# Patient Record
Sex: Female | Born: 1960 | Race: White | Hispanic: No | Marital: Single | Smoking: Never smoker
Health system: Southern US, Community
[De-identification: ages and names within clinical notes are randomized; demographics above are authoritative.]

## PROBLEM LIST (undated history)

## (undated) DIAGNOSIS — D509 Iron deficiency anemia, unspecified: Secondary | ICD-10-CM

## (undated) HISTORY — DX: Iron deficiency anemia, unspecified: D50.9

---
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
## 2002-07-27 ENCOUNTER — Encounter: Admission: RE | Admit: 2002-07-27 | Discharge: 2002-07-27 | Payer: Self-pay | Admitting: Family Medicine

## 2006-07-17 ENCOUNTER — Ambulatory Visit (HOSPITAL_COMMUNITY): Payer: Self-pay | Admitting: *Deleted

## 2008-01-19 ENCOUNTER — Emergency Department (HOSPITAL_COMMUNITY): Admission: EM | Admit: 2008-01-19 | Discharge: 2008-01-19 | Payer: Self-pay | Admitting: Emergency Medicine

## 2010-03-19 ENCOUNTER — Emergency Department (HOSPITAL_COMMUNITY): Admission: EM | Admit: 2010-03-19 | Discharge: 2010-03-20 | Payer: Self-pay | Admitting: Emergency Medicine

## 2010-03-20 ENCOUNTER — Inpatient Hospital Stay (HOSPITAL_COMMUNITY): Admission: AD | Admit: 2010-03-20 | Discharge: 2010-03-23 | Payer: Self-pay | Admitting: Psychiatry

## 2010-03-20 ENCOUNTER — Ambulatory Visit: Payer: Self-pay | Admitting: Psychiatry

## 2010-03-20 DIAGNOSIS — IMO0002 Reserved for concepts with insufficient information to code with codable children: Secondary | ICD-10-CM

## 2010-07-24 LAB — CBC
MCH: 20.9 pg — ABNORMAL LOW (ref 26.0–34.0)
MCHC: 30.8 g/dL (ref 30.0–36.0)
Platelets: 415 10*3/uL — ABNORMAL HIGH (ref 150–400)
RBC: 4.83 MIL/uL (ref 3.87–5.11)
RDW: 17.7 % — ABNORMAL HIGH (ref 11.5–15.5)

## 2010-07-24 LAB — DIFFERENTIAL
Basophils Absolute: 0 10*3/uL (ref 0.0–0.1)
Eosinophils Absolute: 0.1 10*3/uL (ref 0.0–0.7)
Lymphs Abs: 2.2 10*3/uL (ref 0.7–4.0)
Monocytes Absolute: 0.9 10*3/uL (ref 0.1–1.0)

## 2010-07-24 LAB — BASIC METABOLIC PANEL
CO2: 20 mEq/L (ref 19–32)
Calcium: 10 mg/dL (ref 8.4–10.5)
Creatinine, Ser: 1.11 mg/dL (ref 0.4–1.2)
Glucose, Bld: 107 mg/dL — ABNORMAL HIGH (ref 70–99)
Potassium: 3.8 mEq/L (ref 3.5–5.1)
Sodium: 140 mEq/L (ref 135–145)

## 2010-07-24 LAB — URINALYSIS, ROUTINE W REFLEX MICROSCOPIC
Bilirubin Urine: NEGATIVE
Hgb urine dipstick: NEGATIVE
Protein, ur: NEGATIVE mg/dL
Urobilinogen, UA: 0.2 mg/dL (ref 0.0–1.0)

## 2010-07-24 LAB — RAPID URINE DRUG SCREEN, HOSP PERFORMED
Barbiturates: NOT DETECTED
Benzodiazepines: NOT DETECTED
Cocaine: POSITIVE — AB

## 2010-07-24 LAB — ETHANOL: Alcohol, Ethyl (B): <5 mg/dL (ref 0–10)

## 2010-07-24 LAB — RETICULOCYTES
RBC.: 4.62 MIL/uL (ref 3.87–5.11)
Retic Ct Pct: 1.4 % (ref 0.4–3.1)

## 2010-07-24 LAB — IRON AND TIBC
Iron: 20 ug/dL — ABNORMAL LOW (ref 42–135)
Saturation Ratios: 4 % — ABNORMAL LOW (ref 20–55)
UIBC: 441 ug/dL

## 2010-07-24 LAB — TRICYCLICS SCREEN, URINE: TCA Scrn: NOT DETECTED

## 2010-07-24 LAB — FERRITIN: Ferritin: 2 ng/mL — ABNORMAL LOW (ref 10–291)

## 2012-05-13 DIAGNOSIS — D509 Iron deficiency anemia, unspecified: Secondary | ICD-10-CM

## 2012-05-13 HISTORY — DX: Iron deficiency anemia, unspecified: D50.9

## 2016-08-20 ENCOUNTER — Other Ambulatory Visit: Payer: Self-pay | Admitting: Internal Medicine

## 2016-08-22 ENCOUNTER — Other Ambulatory Visit: Payer: PRIVATE HEALTH INSURANCE | Admitting: Internal Medicine

## 2016-08-22 DIAGNOSIS — Z13220 Encounter for screening for lipoid disorders: Secondary | ICD-10-CM

## 2016-08-22 DIAGNOSIS — Z1329 Encounter for screening for other suspected endocrine disorder: Secondary | ICD-10-CM

## 2016-08-22 DIAGNOSIS — Z0000 Encounter for general adult medical examination without abnormal findings: Secondary | ICD-10-CM

## 2016-08-22 DIAGNOSIS — Z1321 Encounter for screening for nutritional disorder: Secondary | ICD-10-CM

## 2016-08-22 LAB — CBC WITH DIFFERENTIAL/PLATELET
Basophils Absolute: 0 cells/uL (ref 0–200)
Basophils Relative: 0 %
EOS PCT: 4 %
Eosinophils Absolute: 500 cells/uL (ref 15–500)
HCT: 44.3 % (ref 35.0–45.0)
HEMOGLOBIN: 14.6 g/dL (ref 11.7–15.5)
LYMPHS ABS: 3625 {cells}/uL (ref 850–3900)
Lymphocytes Relative: 29 %
MCH: 28.5 pg (ref 27.0–33.0)
MCHC: 33 g/dL (ref 32.0–36.0)
MCV: 86.5 fL (ref 80.0–100.0)
MPV: 10.4 fL (ref 7.5–12.5)
Monocytes Absolute: 875 cells/uL (ref 200–950)
Monocytes Relative: 7 %
NEUTROS PCT: 60 %
Neutro Abs: 7500 cells/uL (ref 1500–7800)
PLATELETS: 273 10*3/uL (ref 140–400)
RBC: 5.12 MIL/uL — AB (ref 3.80–5.10)
RDW: 15.3 % — AB (ref 11.0–15.0)
WBC: 12.5 10*3/uL — AB (ref 3.8–10.8)

## 2016-08-22 LAB — LIPID PANEL
CHOL/HDL RATIO: 5.4 ratio — AB (ref <5.0)
Cholesterol: 254 mg/dL — ABNORMAL HIGH (ref <200)
HDL: 47 mg/dL — AB (ref >50)
LDL Cholesterol: 165 mg/dL — ABNORMAL HIGH (ref <100)
Triglycerides: 208 mg/dL — ABNORMAL HIGH (ref <150)
VLDL: 42 mg/dL — AB (ref <30)

## 2016-08-22 LAB — COMPREHENSIVE METABOLIC PANEL
ALBUMIN: 4.2 g/dL (ref 3.6–5.1)
ALT: 18 U/L (ref 6–29)
AST: 15 U/L (ref 10–35)
Alkaline Phosphatase: 92 U/L (ref 33–130)
BILIRUBIN TOTAL: 0.5 mg/dL (ref 0.2–1.2)
BUN: 18 mg/dL (ref 7–25)
CO2: 26 mmol/L (ref 20–31)
CREATININE: 0.94 mg/dL (ref 0.50–1.05)
Calcium: 9.9 mg/dL (ref 8.6–10.4)
Chloride: 105 mmol/L (ref 98–110)
GLUCOSE: 88 mg/dL (ref 65–99)
Potassium: 4.8 mmol/L (ref 3.5–5.3)
SODIUM: 138 mmol/L (ref 135–146)
Total Protein: 6.9 g/dL (ref 6.1–8.1)

## 2016-08-22 LAB — TSH: TSH: 1.93 mIU/L

## 2016-08-23 ENCOUNTER — Encounter: Payer: Self-pay | Admitting: Internal Medicine

## 2016-08-23 LAB — VITAMIN D 25 HYDROXY (VIT D DEFICIENCY, FRACTURES): VIT D 25 HYDROXY: 10 ng/mL — AB (ref 30–100)

## 2016-08-26 ENCOUNTER — Encounter: Payer: Self-pay | Admitting: Internal Medicine

## 2016-08-26 ENCOUNTER — Other Ambulatory Visit (HOSPITAL_COMMUNITY)
Admission: RE | Admit: 2016-08-26 | Discharge: 2016-08-26 | Disposition: A | Discharge status: Home / Self Care | Payer: Self-pay | Source: Ambulatory Visit | Attending: Internal Medicine | Admitting: Internal Medicine

## 2016-08-26 ENCOUNTER — Ambulatory Visit (INDEPENDENT_AMBULATORY_CARE_PROVIDER_SITE_OTHER): Payer: PRIVATE HEALTH INSURANCE | Admitting: Internal Medicine

## 2016-08-26 VITALS — BP 178/100 | HR 93 | Temp 98.7°F | Ht 63.25 in | Wt 197.0 lb

## 2016-08-26 DIAGNOSIS — R8290 Unspecified abnormal findings in urine: Secondary | ICD-10-CM | POA: Diagnosis not present

## 2016-08-26 DIAGNOSIS — Z124 Encounter for screening for malignant neoplasm of cervix: Secondary | ICD-10-CM | POA: Diagnosis not present

## 2016-08-26 DIAGNOSIS — I10 Essential (primary) hypertension: Secondary | ICD-10-CM

## 2016-08-26 DIAGNOSIS — Z0000 Encounter for general adult medical examination without abnormal findings: Secondary | ICD-10-CM

## 2016-08-26 DIAGNOSIS — Z01419 Encounter for gynecological examination (general) (routine) without abnormal findings: Secondary | ICD-10-CM | POA: Insufficient documentation

## 2016-08-26 DIAGNOSIS — Z87898 Personal history of other specified conditions: Secondary | ICD-10-CM | POA: Diagnosis not present

## 2016-08-26 DIAGNOSIS — E559 Vitamin D deficiency, unspecified: Secondary | ICD-10-CM

## 2016-08-26 DIAGNOSIS — Z8659 Personal history of other mental and behavioral disorders: Secondary | ICD-10-CM

## 2016-08-26 DIAGNOSIS — Z8669 Personal history of other diseases of the nervous system and sense organs: Secondary | ICD-10-CM | POA: Diagnosis not present

## 2016-08-26 LAB — POCT URINALYSIS DIPSTICK
BILIRUBIN UA: NEGATIVE
GLUCOSE UA: NEGATIVE
Ketones, UA: NEGATIVE
LEUKOCYTES UA: NEGATIVE
NITRITE UA: NEGATIVE
Protein, UA: NEGATIVE
Spec Grav, UA: >=1.03 — AB (ref 1.010–1.025)
UROBILINOGEN UA: 0.2 U/dL
pH, UA: 6 (ref 5.0–8.0)

## 2016-08-26 MED ORDER — LOSARTAN POTASSIUM 50 MG PO TABS: 50.0000 mg | ORAL_TABLET | Freq: Every day | ORAL | 3 refills | Status: DC

## 2016-08-26 MED ORDER — ALPRAZOLAM 0.5 MG PO TABS
0.5000 mg | ORAL_TABLET | Freq: Every evening | ORAL | 1 refills | Status: DC | PRN
Start: 2016-08-26 — End: 2016-11-05

## 2016-08-26 MED ORDER — HYDROCHLOROTHIAZIDE 25 MG PO TABS: 25.0000 mg | ORAL_TABLET | Freq: Every day | ORAL | 3 refills | Status: DC
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
## 2016-08-27 LAB — URINALYSIS, MICROSCOPIC ONLY
Bacteria, UA: NONE SEEN [HPF]
Casts: NONE SEEN [LPF]
RBC / HPF: NONE SEEN RBC/HPF (ref <=2)
WBC, UA: NONE SEEN WBC/HPF (ref <=5)
Yeast: NONE SEEN [HPF]

## 2016-08-27 LAB — URINE CULTURE: Organism ID, Bacteria: NO GROWTH

## 2016-08-30 LAB — CYTOLOGY - PAP: Diagnosis: NEGATIVE

## 2016-09-04 MED ORDER — ERGOCALCIFEROL 1.25 MG (50000 UT) PO CAPS: 50000.0000 [IU] | ORAL_CAPSULE | ORAL | 0 refills | Status: DC

## 2016-09-04 NOTE — Patient Instructions (Signed)
Start losartan 50 mg daily. Refilled atenolol and HCTZ. Take vitamin D 50,000 units weekly for 12 weeks. Return in early May for follow-up. Small quantity of Xanax given to take at bedtime but not every night.

## 2016-09-13 ENCOUNTER — Ambulatory Visit: Payer: PRIVATE HEALTH INSURANCE | Admitting: Internal Medicine

## 2016-09-27 ENCOUNTER — Ambulatory Visit: Payer: PRIVATE HEALTH INSURANCE | Admitting: Internal Medicine

## 2016-09-28 ENCOUNTER — Telehealth: Payer: Self-pay | Admitting: Internal Medicine

## 2016-09-28 ENCOUNTER — Encounter: Payer: Self-pay | Admitting: Internal Medicine

## 2016-09-28 NOTE — Telephone Encounter (Signed)
Telephone call to patient today regarding missed appointment yesterday. Says mother was put in hospital with a collapsed lung but fortunately is coming home today. Patient says she totally forgot about appointment. This point was to follow-up on hypertension. Asked patient to call to schedule another appointment next week and she said she would. Says blood pressure medicine might need to be adjusted. Says she's checked her low pressure a few times and is doing fairly well.

## 2016-11-05 ENCOUNTER — Ambulatory Visit (INDEPENDENT_AMBULATORY_CARE_PROVIDER_SITE_OTHER): Payer: PRIVATE HEALTH INSURANCE | Admitting: Internal Medicine

## 2016-11-05 ENCOUNTER — Encounter: Payer: Self-pay | Admitting: Internal Medicine

## 2016-11-05 VITALS — BP 140/90 | HR 92 | Temp 98.4°F | Ht 63.0 in | Wt 200.0 lb

## 2016-11-05 DIAGNOSIS — F439 Reaction to severe stress, unspecified: Secondary | ICD-10-CM | POA: Diagnosis not present

## 2016-11-05 DIAGNOSIS — I10 Essential (primary) hypertension: Secondary | ICD-10-CM | POA: Insufficient documentation

## 2016-11-05 DIAGNOSIS — Z6835 Body mass index (BMI) 35.0-35.9, adult: Secondary | ICD-10-CM | POA: Diagnosis not present

## 2016-11-05 MED ORDER — ALPRAZOLAM 0.5 MG PO TABS: 0.5000 mg | ORAL_TABLET | Freq: Every evening | ORAL | 2 refills | Status: DC | PRN

## 2016-11-05 MED ORDER — LOSARTAN POTASSIUM 100 MG PO TABS: 100.0000 mg | ORAL_TABLET | Freq: Every day | ORAL | 0 refills | Status: DC

## 2016-11-05 NOTE — Progress Notes (Signed)
   Subjective:    Patient ID: Madison Norton, female    DOB: 12/06/1960, 56 y.o.   MRN: [id number]  HPI Patient is here for follow-up on hypertension. She's been taking losartan 50 mg daily. No longer taking Tenormin. Has not been taking HCTZ on a regular basis. Blood pressure is elevated today at 140 overnight knee. I had hoped it would be better but she's not been taking all  of the recommended medication. She says she was afraid HCTZ would cause urinary frequency.  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 She will need to find other employment. She will be losing her health insurance.  Patient is concerned about her weight. She says she lost 55 pounds in the past on orlistat. I told her I was not willing to prescribe any weight loss drugs at this point in time. I'm interested in her getting her blood pressure under control and being compliant with prescribed medications.  Since she's having considerable situational stress, I have refilled her Xanax.  Explained to her that I thought we should go up on losartan to 100 mg daily and I thought it was important she take HCTZ as well. She has plenty of HCTZ on hand. I have prescribed losartan 100 mg #90 with no refill. She was prescribed Xanax 0.5 mg #30 with 2 refills.    Review of Systems see above     Objective:   Physical Exam Spent 15 minutes speaking with her about these issues.       Assessment & Plan:  Essential hypertension-noncompliant with diuretic. Increase losartan 100 mg daily and asked patient to take diuretic. She should call me with follow-up blood pressures that she takes herself.  Anxiety and situational stress-see above regarding Xanax refill.  Obesity-knees to work on diet exercise and weight loss. Did not prescribe weight loss product today.  Plan: Mention the fact patient should have a basic metabolic  panel on losartan but since were changing doses at this point in time and she is losing her health insurance we have deferred that for now. Expect to see patient in 3 months. Refills will not be given until seen.

## 2016-11-05 NOTE — Patient Instructions (Addendum)
Have refills Xanax for 3 months. Increase losartan from 50-100 mg daily #90 with no refill. Take diuretic as previously prescribed. Return in 3 months.[id number]

## 2016-11-15 ENCOUNTER — Telehealth: Payer: Self-pay

## 2016-11-15 DIAGNOSIS — Z1239 Encounter for other screening for malignant neoplasm of breast: Secondary | ICD-10-CM

## 2016-11-15 NOTE — Telephone Encounter (Signed)
Order for Mammogram placed and letter sent  

## 2017-03-28 ENCOUNTER — Telehealth: Payer: Self-pay | Admitting: Internal Medicine

## 2017-03-28 MED ORDER — LOSARTAN POTASSIUM 100 MG PO TABS: 100.0000 mg | ORAL_TABLET | Freq: Every day | ORAL | 0 refills | Status: DC

## 2017-03-28 NOTE — Telephone Encounter (Signed)
Patient called back and advised that Dr. Lenord FellersBaxley wanted to see her.  Patient advised that she just cannot afford to come in prior to having insurance.  Patient wants to know if Dr. Lenord FellersBaxley will call her in more of the Losartan until her appointment in December.    Spoke with Dr. Lenord FellersBaxley and she agrees to do this until patient can be seen on 12/13.  Patient will keep her appointment and do this.    If patient doesn't keep appointment no additional medication will be called in after this.  Toni AmendCourtney will send medication to [facility].

## 2017-03-28 NOTE — Telephone Encounter (Signed)
Tried to call patient back, has a phone that has a voice mail that hasn't been set up yet.  Couldn't leave a message.  Will try again later.

## 2017-03-28 NOTE — Telephone Encounter (Signed)
Patient calling; states that she has a new job and will have new insurance in December.  Until then, she really cannot come in.  She did make an appointment to come in to see you to adjust her BP medication on 12/13.  She wants to know if you will put her back on Atenolol until then.  States that the Losartan has really been making her VERY sleepy.  States that she missed a dose a couple of times, and she messed around with it a  Little bit and took a 1/2 dose for a few days and really felt much better.  Of course we don't know what her recorded BP was, but she states she felt better.    [facility]  Best # for Contact:  [phone]  Thank you.

## 2017-03-28 NOTE — Telephone Encounter (Signed)
Sorry-- as per last note she needs OV she never called with BP readings and was to have followed up in September.

## 2017-04-24 ENCOUNTER — Ambulatory Visit: Payer: PRIVATE HEALTH INSURANCE | Admitting: Internal Medicine

## 2017-05-01 ENCOUNTER — Encounter: Payer: Self-pay | Admitting: Internal Medicine

## 2017-05-01 ENCOUNTER — Ambulatory Visit (INDEPENDENT_AMBULATORY_CARE_PROVIDER_SITE_OTHER): Payer: 59 | Admitting: Internal Medicine

## 2017-05-01 VITALS — BP 122/90 | HR 92 | Ht 63.0 in | Wt 213.0 lb

## 2017-05-01 DIAGNOSIS — I10 Essential (primary) hypertension: Secondary | ICD-10-CM | POA: Diagnosis not present

## 2017-05-01 DIAGNOSIS — M545 Low back pain, unspecified: Secondary | ICD-10-CM

## 2017-05-01 DIAGNOSIS — E049 Nontoxic goiter, unspecified: Secondary | ICD-10-CM | POA: Diagnosis not present

## 2017-05-01 DIAGNOSIS — G8929 Other chronic pain: Secondary | ICD-10-CM | POA: Diagnosis not present

## 2017-05-01 MED ORDER — CYCLOBENZAPRINE HCL 10 MG PO TABS: ORAL_TABLET | ORAL | 0 refills | Status: DC

## 2017-05-01 MED ORDER — LOSARTAN POTASSIUM 50 MG PO TABS: 50.0000 mg | ORAL_TABLET | Freq: Every day | ORAL | 1 refills | Status: DC

## 2017-05-01 MED ORDER — HYDROCHLOROTHIAZIDE 25 MG PO TABS: 25.0000 mg | ORAL_TABLET | Freq: Every day | ORAL | 0 refills | Status: DC

## 2017-05-01 NOTE — Progress Notes (Signed)
   Subjective:    Patient ID: Madison Norton, female    DOB: 15-Mar-1961, 56 y.o.   MRN: [id number]  HPI We have not seen her since June 2018.  At that visit we increased her losartan from 50-100 mg daily.  She said it made her feel tired.  She tried cutting back on it and felt she felt better.  So for a long time she was taking anxious losartan 50 mg daily.  Around that time she lost her job.  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  I told her I was uncomfortable with her treating herself and deciding on what dosage she was going to be taking but she said she was trying to make it last.  Today her blood pressure is pretty good but she took 100 mg of Cozaar this morning.  So it is impossible for me to know what dose is correct from her because she has been changing up on the dosages.  I have explained to her I think we will stay with losartan 50 mg and HCTZ 25 mg until she comes back in 3 weeks.  At that time if she is consistent with those dosages we will do a being met.  Also at her last visit we talked about her thyroid ultrasound.  She is interested in that once again but she is concerned about the cost.  She may call [facility] to find that out.  I went ahead and placed order for thyroid ultrasound.  She is having some low back pain and I have prescribed some Flexeril for her to take at bedtime.  She was late for her appointment today.    Review of Systems     Objective:   Physical Exam        Assessment & Plan:

## 2017-05-01 NOTE — Patient Instructions (Signed)
Losartan 50 mg daily and HCTZ 25 mg daily.  Follow-up in 3 weeks.  Have ordered thyroid ultrasound to assess goiter/nodules.  Flexeril 10 mg 1/2-1 tablet at bedtime for back pain.

## 2017-05-19 ENCOUNTER — Other Ambulatory Visit: Payer: Self-pay

## 2017-05-19 MED ORDER — ALPRAZOLAM 0.5 MG PO TABS: 0.5000 mg | ORAL_TABLET | Freq: Every evening | ORAL | 1 refills | Status: DC | PRN

## 2017-05-19 NOTE — Telephone Encounter (Signed)
Called in to [facility] [address] - , [place] - [phone] 1  Refill

## 2017-05-21 ENCOUNTER — Other Ambulatory Visit: Payer: Self-pay | Admitting: Internal Medicine

## 2017-05-21 DIAGNOSIS — I10 Essential (primary) hypertension: Secondary | ICD-10-CM

## 2017-05-21 NOTE — Progress Notes (Signed)
b

## 2017-05-22 ENCOUNTER — Ambulatory Visit: Payer: 59 | Admitting: Internal Medicine

## 2017-06-02 ENCOUNTER — Other Ambulatory Visit: Payer: Self-pay | Admitting: Internal Medicine

## 2017-06-02 ENCOUNTER — Ambulatory Visit: Payer: 59 | Admitting: Internal Medicine

## 2017-06-02 DIAGNOSIS — I10 Essential (primary) hypertension: Secondary | ICD-10-CM

## 2017-06-03 ENCOUNTER — Encounter: Payer: Self-pay | Admitting: Internal Medicine

## 2017-06-03 ENCOUNTER — Ambulatory Visit: Payer: 59 | Admitting: Internal Medicine

## 2017-06-03 VITALS — BP 140/92 | HR 104 | Ht 63.0 in | Wt 210.0 lb

## 2017-06-03 DIAGNOSIS — Z8659 Personal history of other mental and behavioral disorders: Secondary | ICD-10-CM

## 2017-06-03 DIAGNOSIS — J0100 Acute maxillary sinusitis, unspecified: Secondary | ICD-10-CM | POA: Diagnosis not present

## 2017-06-03 DIAGNOSIS — I10 Essential (primary) hypertension: Secondary | ICD-10-CM

## 2017-06-03 DIAGNOSIS — Z87898 Personal history of other specified conditions: Secondary | ICD-10-CM | POA: Diagnosis not present

## 2017-06-03 MED ORDER — LOSARTAN POTASSIUM-HCTZ 100-25 MG PO TABS: 1.0000 | ORAL_TABLET | Freq: Every day | ORAL | 1 refills | Status: DC

## 2017-06-03 MED ORDER — DOXYCYCLINE HYCLATE 100 MG PO TABS: 100.0000 mg | ORAL_TABLET | Freq: Two times a day (BID) | ORAL | 0 refills | Status: DC

## 2017-06-03 MED ORDER — LORAZEPAM 0.5 MG PO TABS: 0.5000 mg | ORAL_TABLET | Freq: Two times a day (BID) | ORAL | 0 refills | Status: DC | PRN

## 2017-06-03 NOTE — Patient Instructions (Addendum)
Increase Losartan to 100 mg and continue HCTZ  25 mg daily. Combine as one tablet. RTC 4 weeks.  Take doxycycline for respiratory infection.

## 2017-06-03 NOTE — Progress Notes (Signed)
   Subjective:    Patient ID: Madison Norton, female    DOB: 06/05/1960, 57 y.o.   MRN: [id number]  HPI In today to follow-up on hypertension.  At last visit we agreed she would try losartan 50 mg daily and HCTZ 25 mg daily.  We talked about ordering a thyroid ultrasound to assess goiter but I do not think she wants to go through with that at the present time.  She was given Flexeril at the time for back pain.  She has a history of insomnia and anxiety.  Has come down with respiratory infection.  Basic metabolic panel drawn today.  Taking Ativan 0.5 mg twice daily as needed  Review of Systems     Objective:   Physical Exam Blood pressure today is 140/92  TMs and chest are clear.  Pharynx is clear.  She sounds nasally congested.     Assessment & Plan:  Acute lower respiratory infection  Plan: Doxycycline 100 mg twice daily for 10 days.  Increase losartan to 100 mg daily and continue 25 mg daily of HCTZ.  Thus prescribed losartan 100/25-daily follow-up mid February with regard to blood pressure.

## 2017-06-04 LAB — BASIC METABOLIC PANEL
BUN: 13 mg/dL (ref 7–25)
CALCIUM: 10.6 mg/dL — AB (ref 8.6–10.4)
CHLORIDE: 103 mmol/L (ref 98–110)
CO2: 29 mmol/L (ref 20–32)
Creat: 0.9 mg/dL (ref 0.50–1.05)
Glucose, Bld: 123 mg/dL — ABNORMAL HIGH (ref 65–99)
Potassium: 4.4 mmol/L (ref 3.5–5.3)
Sodium: 140 mmol/L (ref 135–146)

## 2017-06-09 ENCOUNTER — Encounter: Payer: Self-pay | Admitting: Internal Medicine

## 2017-06-30 ENCOUNTER — Ambulatory Visit (INDEPENDENT_AMBULATORY_CARE_PROVIDER_SITE_OTHER): Payer: 59 | Admitting: Internal Medicine

## 2017-06-30 ENCOUNTER — Encounter: Payer: Self-pay | Admitting: Internal Medicine

## 2017-06-30 VITALS — BP 120/80 | HR 101 | Ht 63.0 in | Wt 209.0 lb

## 2017-06-30 DIAGNOSIS — Z8659 Personal history of other mental and behavioral disorders: Secondary | ICD-10-CM | POA: Diagnosis not present

## 2017-06-30 DIAGNOSIS — G43109 Migraine with aura, not intractable, without status migrainosus: Secondary | ICD-10-CM | POA: Diagnosis not present

## 2017-06-30 DIAGNOSIS — I10 Essential (primary) hypertension: Secondary | ICD-10-CM | POA: Diagnosis not present

## 2017-06-30 MED ORDER — ZOLMITRIPTAN 5 MG PO TABS: ORAL_TABLET | ORAL | 0 refills | Status: DC

## 2017-06-30 NOTE — Patient Instructions (Signed)
Continue losartan HCTZ 100/25 as previously prescribed.  May try Zomig for migraine headache with aura.  Continue diet exercise and weight loss regimen.  Try to take pulse on a regular basis and see if it is fast.  Try to take blood pressure at least once a week.  Return in 3 months.

## 2017-06-30 NOTE — Progress Notes (Signed)
   Subjective:    Patient ID: Madison Norton, female    DOB: 12/21/1960, 57 y.o.   MRN: [id number]  HPI She is here today to follow-up on hypertension.  Currently on losartan HCTZ 100/25.  Blood pressure is good today.  This morning she had what she calls an ocular migraine where she had an aura and then subsequently developed a headache.  Sometimes she does not develop a headache.  I have given her a coupon for Zomig and prescribe Zomig tablets 5 mg to take at onset of headache.  She has had these for years.  New job continues to be a bit stressful.    Review of Systems see above     Objective:   Physical Exam  Blood pressure on arrival was 128/88.  When I checked it it was 140/90.  I rechecked her blood pressure and it was 120/80.      Assessment & Plan:  Migraine headaches with aura-prescribe Zomig 5 mg at onset of headache  Essential hypertension-her pulse is elevated a bit today.  She attributes this to anxiety.  She would like to exercise more when the weather improves.  She used  to take Tenormin for hypertension which likely controlled her pulse.  We will continue to monitor blood pressure and she will return in 3 months.  For now we are going to keep her on losartan HCTZ 100/25 daily.  I would like her to check her pulse on a regular basis.  If tachycardia persists we could consider adding Tenormin 25 mg daily particularly if blood pressure is labile.  I would like her to take her blood pressure at least once a week and to take her pulse daily.

## 2017-07-08 ENCOUNTER — Telehealth: Payer: Self-pay | Admitting: Internal Medicine

## 2017-07-08 NOTE — Telephone Encounter (Signed)
Needs OV.  

## 2017-07-08 NOTE — Telephone Encounter (Signed)
Spoke with Dr. Lenord FellersBaxley and she advised that patient must have an office visit based on her symptoms.  Called patient back and she said she really doesn't have the money to come in.  Offered her appointment for tomorrow, 2/27 @ 11:00 a.m.  Advised patient that we do not normally see patient's on Wednesday, but Dr. Lenord FellersBaxley will see her at [phone].  Patient is going to rest up tonight and call first thing in the morning to confirm appointment.

## 2017-07-08 NOTE — Telephone Encounter (Signed)
Madison SpanielSaid she has been sick she was here on 2/18 of last week.  On Wednesday, she started with fever and body aches and dizzy feeling.  She thought she would just take some ibuprofen for the fever and she had some Doxycycline that you had given her for something earlier and she thought she would just use that IF she needed it.  She said by the weekend she was in the bed and didn't really have the strength to get out of bed to make a sandwich or anything.  She went back to work today, but she is worn out and thinks it was a mistake to go back today.  She says she still feels light headed and is worn out this evening.  She now wonders if she had the flu and says that she didn't start taking the doxycycline until yesterday.  She hasn't taken her temperature.  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    She doesn't want to have to come in unless she has to since she was just here last week.  Advised her that she wasn't having these symptoms on Monday of last week when she was here.  And, it's hard to know how to manage her illness when you can't look at her and listen to her chest.  And, it sounds like she has a lot going on based on what she just told me.  However, I would speak with you.  Also advised her that we don't see patients on Wednesday and it's 4:50 on Tuesday afternoon.        Ph[facility]:  [facility] [address]  Phone #:  [phone]

## 2017-07-09 ENCOUNTER — Encounter: Payer: Self-pay | Admitting: Internal Medicine

## 2017-07-09 ENCOUNTER — Ambulatory Visit (INDEPENDENT_AMBULATORY_CARE_PROVIDER_SITE_OTHER): Payer: 59 | Admitting: Internal Medicine

## 2017-07-09 ENCOUNTER — Ambulatory Visit
Admission: RE | Admit: 2017-07-09 | Discharge: 2017-07-09 | Disposition: A | Discharge status: Home / Self Care | Payer: No Typology Code available for payment source | Source: Ambulatory Visit | Attending: Internal Medicine | Admitting: Internal Medicine

## 2017-07-09 VITALS — BP 110/80 | HR 103 | Temp 99.1°F | Wt 202.0 lb

## 2017-07-09 DIAGNOSIS — R6883 Chills (without fever): Secondary | ICD-10-CM

## 2017-07-09 DIAGNOSIS — R05 Cough: Secondary | ICD-10-CM

## 2017-07-09 DIAGNOSIS — J181 Lobar pneumonia, unspecified organism: Secondary | ICD-10-CM | POA: Diagnosis not present

## 2017-07-09 DIAGNOSIS — J189 Pneumonia, unspecified organism: Secondary | ICD-10-CM

## 2017-07-09 DIAGNOSIS — R509 Fever, unspecified: Secondary | ICD-10-CM

## 2017-07-09 DIAGNOSIS — R059 Cough, unspecified: Secondary | ICD-10-CM

## 2017-07-09 LAB — POCT INFLUENZA A/B
Influenza A, POC: NEGATIVE
Influenza B, POC: NEGATIVE

## 2017-07-09 MED ORDER — BUDESONIDE-FORMOTEROL FUMARATE 160-4.5 MCG/ACT IN AERO: 2.0000 | INHALATION_SPRAY | Freq: Two times a day (BID) | RESPIRATORY_TRACT | 3 refills | Status: DC

## 2017-07-09 MED ORDER — HYDROCODONE-HOMATROPINE 5-1.5 MG/5ML PO SYRP: 5.0000 mL | ORAL_SOLUTION | Freq: Three times a day (TID) | ORAL | 0 refills | Status: DC | PRN

## 2017-07-09 MED ORDER — CEFTRIAXONE SODIUM 1 G IJ SOLR
1.0000 g | Freq: Once | INTRAMUSCULAR | Status: AC
  Administered 2017-07-09: 1 g via INTRAMUSCULAR

## 2017-07-09 NOTE — Progress Notes (Signed)
   Subjective:    Patient ID: Madison Norton, female    DOB: 01/02/1961, 57 y.o.   MRN: [id number]  HPI 57 year old Female in today with onset a week ago of cough.  Thought she was coming down with a cold.  Several coworkers have been ill with flulike illnesses.  Cough is been dry and hacking with no significant production.  She tried Delsym and Zicam.  She tried to go to work yesterday but felt awful and was overwhelmed with the amount of customer she had to deal with.  She has malaise and fatigue.  She cannot think straight.  No documented temperature because she does not have a thermometer.  She has had chills.  Rapid flu test today in the office is negative.  She looks fatigued.  Does not really have headache but says he had feels a bit foggy and she feels she cannot quite think straight or concentrate.  She did not take flu vaccine this season.    Review of Systems no nausea or vomiting.  Only urinated once yesterday.     Objective:   Physical Exam Skin warm and dry.  TMs are slightly full bilaterally.  Pharynx is clear.  Neck is supple without adenopathy.  She has rhonchi and rales in left lower lobe with slight wheezing.  Has congested cough.  Chest x-ray is negative for pneumonia but she is volume depleted.       Assessment & Plan:  Probable left lower lobe pneumonia  Plan: Out of work for the remainder of the week.  Rocephin 1 g IM given in office.  Finish course of doxycycline that she started.  She has 5 additional days of antibiotic.  Follow-up on Monday March 4.  Rest and drink plenty of fluids.  Needs to hydrate well.  Coupon given for Symbicort inhaler 160/4.5.  Hycodan 1 teaspoon p.o. every 8 hours as needed cough.

## 2017-07-09 NOTE — Progress Notes (Deleted)
CHES

## 2017-07-09 NOTE — Patient Instructions (Signed)
Rest and drink plenty of fluids.  Rocephin 1 g IM given in office.  Continue doxycycline 100 mg twice daily.  Hycodan 1 teaspoon p.o. every 8 hours as needed cough.

## 2017-07-10 NOTE — Telephone Encounter (Signed)
Patient was given appointment for 2/27 and seen and treated.

## 2017-07-14 ENCOUNTER — Ambulatory Visit (INDEPENDENT_AMBULATORY_CARE_PROVIDER_SITE_OTHER): Payer: 59 | Admitting: Internal Medicine

## 2017-07-14 ENCOUNTER — Encounter: Payer: Self-pay | Admitting: Internal Medicine

## 2017-07-14 VITALS — BP 120/90 | HR 114 | Temp 99.6°F | Wt 203.0 lb

## 2017-07-14 DIAGNOSIS — J22 Unspecified acute lower respiratory infection: Secondary | ICD-10-CM | POA: Diagnosis not present

## 2017-07-14 DIAGNOSIS — J0100 Acute maxillary sinusitis, unspecified: Secondary | ICD-10-CM | POA: Diagnosis not present

## 2017-07-14 MED ORDER — DOXYCYCLINE HYCLATE 100 MG PO TABS: 100.0000 mg | ORAL_TABLET | Freq: Two times a day (BID) | ORAL | 0 refills | Status: DC

## 2017-07-23 ENCOUNTER — Telehealth: Payer: Self-pay | Admitting: Internal Medicine

## 2017-07-23 MED ORDER — HYDROCODONE-HOMATROPINE 5-1.5 MG/5ML PO SYRP: 5.0000 mL | ORAL_SOLUTION | Freq: Three times a day (TID) | ORAL | 0 refills | Status: DC | PRN

## 2017-07-23 NOTE — Progress Notes (Signed)
   Subjective:    Patient ID: Madison Norton, female    DOB: 02/21/1961, 57 y.o.   MRN: [id number]  HPI Here today to follow-up on bronchitis and flu symptoms.  Still does not feel well.  She is weak and washed out.  Tried to rest over the weekend.  She has almost finished doxycycline.  Did not go to work today.  Did not eat much over the weekend.  Has Symbicort inhaler.  Still coughing.  Has Hycodan.    Review of Systems see above     Objective:   Physical Exam Looks fatigued.  Pharynx is clear.  TMs are clear.  Neck is supple.  Chest clear to auscultation       Assessment & Plan:  Acute bronchitis-slowly improving  Plan: Refill doxycycline for an additional 10 days.  Hycodan sparingly for cough.  Stay well-hydrated.

## 2017-07-23 NOTE — Telephone Encounter (Signed)
Printed rx, pt was notified to come pick up rx.

## 2017-07-23 NOTE — Telephone Encounter (Signed)
Refill Hycodan and patient can pick up prescription

## 2017-07-23 NOTE — Telephone Encounter (Signed)
She's feeling much better but still has the lingering cough.  She's tried to use the cough syrup sparingly but is out.  She is coughing at night and can't sleep well.  She has gone back to work.  She has had to go in late however on a few days because she has been tired from not being able to sleep.  She's going in late today because she couldn't sleep last night for coughing.    I told her that we cannot call Hycodan in, this is something that she would have to come and pick up.  Will you refill it?  Or, does she need to take something else?

## 2017-08-05 ENCOUNTER — Encounter: Payer: Self-pay | Admitting: Internal Medicine

## 2017-08-05 NOTE — Patient Instructions (Signed)
Repeat course of doxycycline 100 mg twice daily for 10 days.  Take  Hycodan sparingly.  May return to work tomorrow.

## 2017-09-10 ENCOUNTER — Other Ambulatory Visit: Payer: Self-pay | Admitting: Internal Medicine

## 2017-09-29 ENCOUNTER — Ambulatory Visit: Payer: 59 | Admitting: Internal Medicine

## 2017-11-28 ENCOUNTER — Other Ambulatory Visit: Payer: Self-pay | Admitting: Internal Medicine

## 2017-12-08 ENCOUNTER — Ambulatory Visit (INDEPENDENT_AMBULATORY_CARE_PROVIDER_SITE_OTHER): Payer: 59 | Admitting: Internal Medicine

## 2017-12-08 ENCOUNTER — Encounter: Payer: Self-pay | Admitting: Internal Medicine

## 2017-12-08 VITALS — BP 130/84 | HR 80 | Ht 63.0 in | Wt 210.0 lb

## 2017-12-08 DIAGNOSIS — I10 Essential (primary) hypertension: Secondary | ICD-10-CM

## 2017-12-08 DIAGNOSIS — Z8659 Personal history of other mental and behavioral disorders: Secondary | ICD-10-CM | POA: Diagnosis not present

## 2017-12-08 DIAGNOSIS — M545 Low back pain, unspecified: Secondary | ICD-10-CM | POA: Insufficient documentation

## 2017-12-08 DIAGNOSIS — Z87898 Personal history of other specified conditions: Secondary | ICD-10-CM | POA: Diagnosis not present

## 2017-12-08 DIAGNOSIS — Z6837 Body mass index (BMI) 37.0-37.9, adult: Secondary | ICD-10-CM

## 2017-12-08 DIAGNOSIS — F419 Anxiety disorder, unspecified: Secondary | ICD-10-CM | POA: Insufficient documentation

## 2017-12-08 LAB — BASIC METABOLIC PANEL
BUN: 16 mg/dL (ref 7–25)
CALCIUM: 10.5 mg/dL — AB (ref 8.6–10.4)
CO2: 26 mmol/L (ref 20–32)
Chloride: 104 mmol/L (ref 98–110)
Creat: 0.99 mg/dL (ref 0.50–1.05)
Glucose, Bld: 108 mg/dL — ABNORMAL HIGH (ref 65–99)
Potassium: 4 mmol/L (ref 3.5–5.3)
Sodium: 140 mmol/L (ref 135–146)

## 2017-12-08 MED ORDER — LORAZEPAM 1 MG PO TABS: 1.0000 mg | ORAL_TABLET | Freq: Two times a day (BID) | ORAL | 0 refills | Status: DC | PRN

## 2017-12-08 MED ORDER — LOSARTAN POTASSIUM-HCTZ 100-25 MG PO TABS: 1.0000 | ORAL_TABLET | Freq: Every day | ORAL | 0 refills | Status: DC

## 2017-12-08 MED ORDER — TRAMADOL HCL 50 MG PO TABS: 50.0000 mg | ORAL_TABLET | Freq: Two times a day (BID) | ORAL | 1 refills | Status: DC | PRN

## 2017-12-08 NOTE — Progress Notes (Signed)
   Subjective:    Patient ID: Madison Norton, female    DOB: 12/18/1960, 57 y.o.   MRN: [id number]  HPI 57 year old Female in today to follow-up on essential hypertension, situational stress and anxiety.  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  She does not plan to take the animal to work.  She would like to travel with it.  Says puppy has helped her anxiety.  It gets her out of the house.  However, she wants a refill on lorazepam for anxiety and would like it increased from 0.5 mg to 1 mg.  She is also having some low back pain and would like to have some pain medication on hand.  I have agreed to give her some tramadol for back pain.  She really came in today for follow-up on hypertension because she had not been here in some time and her prescription needed to be refilled.  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  Has been thinking about moving to [place].    Review of Systems see above     Objective:   Physical Exam Blood pressure stable at 130/84.  BMI is 37.20 and height is 5 feet 3 inches.  Pulse is 80 and regular.  Chest clear.  Cardiac exam regular rate and rhythm.  No lower extremity edema.       Assessment & Plan:  BMI 37.20  Anxiety-prescribed lorazepam- prescribed 1 mg twice daily #60 with 0 refill  Letter requested and written to have service dog for travel  Essential hypertension-stable on current regimen of losartan HCTZ-refill #90 with no refill  Low back pain to be treated sparingly with tramadol 50 mg #30 with 1 refill to take 1 p.o. every 12 hours as needed pain.  Basic metabolic panel drawn today.  She needs physical exam in the next 6 months.  Last physical exam was [place] 2018.  Finances have been an issue for her.

## 2017-12-08 NOTE — Patient Instructions (Addendum)
Letter written for service dog.  Continue  same antihypertensive medication.  Basic metabolic panel drawn and pending.  Lorazepam 1 mg twice daily as needed for anxiety #60 with no refill.  Refill antihypertensive medication for 90 days with no refill.  Need physical exam in the next 6 months.  Take tramadol sparingly every 12 hours for back pain #30 with 1 refill.

## 2017-12-09 ENCOUNTER — Other Ambulatory Visit: Payer: Self-pay | Admitting: Internal Medicine

## 2017-12-09 ENCOUNTER — Encounter: Payer: Self-pay | Admitting: Internal Medicine

## 2017-12-10 ENCOUNTER — Other Ambulatory Visit: Payer: Self-pay | Admitting: Internal Medicine

## 2017-12-15 ENCOUNTER — Other Ambulatory Visit: Payer: 59 | Admitting: Internal Medicine

## 2017-12-17 ENCOUNTER — Telehealth: Payer: Self-pay | Admitting: Internal Medicine

## 2017-12-17 ENCOUNTER — Encounter: Payer: Self-pay | Admitting: Internal Medicine

## 2017-12-17 NOTE — Progress Notes (Unsigned)
Did not come for lab. Called pt on August 7th to reschedule. Agrees to come August 15.
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
## 2017-12-25 ENCOUNTER — Other Ambulatory Visit: Payer: 59 | Admitting: Internal Medicine

## 2017-12-26 LAB — PTH, INTACT AND CALCIUM
CALCIUM: 10.5 mg/dL — AB (ref 8.6–10.4)
PTH: 22 pg/mL (ref 14–64)

## 2018-02-01 ENCOUNTER — Other Ambulatory Visit: Payer: Self-pay | Admitting: Internal Medicine

## 2018-02-10 ENCOUNTER — Other Ambulatory Visit: Payer: Self-pay | Admitting: Internal Medicine

## 2018-03-26 ENCOUNTER — Other Ambulatory Visit: Payer: Self-pay | Admitting: Emergency Medicine

## 2018-03-26 MED ORDER — LOSARTAN POTASSIUM 100 MG PO TABS: 100.0000 mg | ORAL_TABLET | Freq: Every day | ORAL | 0 refills | Status: DC

## 2018-03-26 MED ORDER — LORAZEPAM 1 MG PO TABS: 1.0000 mg | ORAL_TABLET | Freq: Two times a day (BID) | ORAL | 2 refills | Status: DC | PRN

## 2018-03-26 NOTE — Telephone Encounter (Signed)
Pt was told she needed CPE in next 6 months in July. She will at least need OV in January. Refill Losartan and Lorazepam x 90 days.

## 2018-03-26 NOTE — Telephone Encounter (Signed)
Pt called stating she has lost her job and her insurance so she is needing a refill of her medications at a cheaper pharmacy. She is requesting a refill on her losartan (COZAAR) 100 MG tablet. [facility]acy is [facility] on [address]. She is also wondering if she can get a refill on either her Lorazepam 1MG  tablet or her Xanax, Pt stated she prefers Xanax. She is having an extremely difficult time with the job loss. Thanks.

## 2018-03-30 NOTE — Addendum Note (Signed)
Addended by: Gregery NaVALENCIA, Falisa Lamora P on: 03/30/2018 04:51 PM   Modules accepted: Orders

## 2018-03-30 NOTE — Telephone Encounter (Signed)
Okay to change losartan?

## 2018-03-30 NOTE — Telephone Encounter (Signed)
Patient called checking on these refills. She states that she would like them sent to the [facility] on [address]. Patient also mentioned that the Losartan should be losartan-hydrochlorothiazide (HYZAAR) 100-25 MG tablet. She said that at one point the pharmacy she was using was out of it so it was switched and she would like to take was she was previously on.

## 2018-03-31 MED ORDER — LOSARTAN POTASSIUM-HCTZ 100-25 MG PO TABS: 1.0000 | ORAL_TABLET | Freq: Every day | ORAL | 0 refills | Status: DC

## 2018-03-31 MED ORDER — LORAZEPAM 1 MG PO TABS: 1.0000 mg | ORAL_TABLET | Freq: Two times a day (BID) | ORAL | 1 refills | Status: DC | PRN

## 2018-03-31 NOTE — Telephone Encounter (Signed)
Refill these untilFeb

## 2018-04-01 ENCOUNTER — Other Ambulatory Visit: Payer: Self-pay

## 2018-04-01 MED ORDER — LOSARTAN POTASSIUM-HCTZ 100-25 MG PO TABS: 1.0000 | ORAL_TABLET | Freq: Every day | ORAL | 0 refills | Status: DC

## 2018-04-01 MED ORDER — LORAZEPAM 1 MG PO TABS: 1.0000 mg | ORAL_TABLET | Freq: Two times a day (BID) | ORAL | 1 refills | Status: DC | PRN

## 2018-04-02 ENCOUNTER — Telehealth: Payer: Self-pay | Admitting: Internal Medicine

## 2018-04-02 MED ORDER — LOSARTAN POTASSIUM-HCTZ 100-25 MG PO TABS: 1.0000 | ORAL_TABLET | Freq: Every day | ORAL | 0 refills | Status: DC

## 2018-04-02 MED ORDER — LORAZEPAM 1 MG PO TABS: 1.0000 mg | ORAL_TABLET | Freq: Two times a day (BID) | ORAL | 0 refills | Status: DC | PRN

## 2018-04-02 NOTE — Telephone Encounter (Signed)
Pt called after 5pm today saying her prescriptions were not at pharmacy. Says she has changed pharmacies and had called and informed my staff earlier in the week that she had changed from [facility]  on [address] to [facility] on [address]. She apparently needs Lorazepam and Losartan HCTZ. She has agreed to be seen January 10th. She says she lost her job recently.  Her Rxs at [facility] have been electronically discontinued. New Rx sent for Losartan HCTZ 100/25 #90 with no refill to  [facility] [address] tonight and also Rx for #60 Lorazepam 1 mg with no refill to [facility] [address]. Left voicemail for pt this evening explaining what has been done. No other refills until seen in January.  MJB,MD

## 2018-05-21 NOTE — Telephone Encounter (Signed)
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  She is not able to come tomorrow due to just starting job.  She will have insurance in March and would like to R/S to come then.  She can come sooner, but will have to ask off and this is a new job and she would not have insurance.  Booked for 3/9 @ 2:45 for 6 month f/u.  Labs were not ordered, so this is just a 6 month recheck as patient has not been seen in several months.  And, patient has not had a CPE since her first CPE in 2018.

## 2018-05-22 ENCOUNTER — Ambulatory Visit: Payer: 59 | Admitting: Internal Medicine

## 2018-05-24 ENCOUNTER — Other Ambulatory Visit: Payer: Self-pay | Admitting: Internal Medicine

## 2018-05-26 ENCOUNTER — Ambulatory Visit (INDEPENDENT_AMBULATORY_CARE_PROVIDER_SITE_OTHER): Payer: Self-pay | Admitting: Internal Medicine

## 2018-05-26 ENCOUNTER — Telehealth: Payer: Self-pay | Admitting: Internal Medicine

## 2018-05-26 ENCOUNTER — Encounter: Payer: Self-pay | Admitting: Internal Medicine

## 2018-05-26 VITALS — BP 150/100 | HR 106 | Ht 63.0 in

## 2018-05-26 DIAGNOSIS — S7011XA Contusion of right thigh, initial encounter: Secondary | ICD-10-CM

## 2018-05-26 DIAGNOSIS — S8001XA Contusion of right knee, initial encounter: Secondary | ICD-10-CM

## 2018-05-26 DIAGNOSIS — M25461 Effusion, right knee: Secondary | ICD-10-CM
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
## 2018-05-26 NOTE — Patient Instructions (Signed)
To see orthopedist regarding contusion of right quadriceps and right knee.  On January 12 she received 30 x 50 mg tramadol tablets with directions to take 1 p.o. every 12 hours.  Continue ice and elevation.  Continue anti-inflammatory medication.

## 2018-05-27 NOTE — Telephone Encounter (Signed)
Appt tomorrow at [facility]. Pt aware

## 2018-05-29 ENCOUNTER — Telehealth: Payer: Self-pay | Admitting: Internal Medicine

## 2018-05-29 NOTE — Telephone Encounter (Signed)
Seen at [facility] and is to have MRI next week. Says she may have quadriceps tendon tear. Has knee immobilizer. Hopes to go back to work Tuesday. I called to see how she was doing.

## 2018-07-20 ENCOUNTER — Ambulatory Visit: Payer: Self-pay | Admitting: Internal Medicine

## 2018-07-24 ENCOUNTER — Other Ambulatory Visit: Payer: Self-pay | Admitting: Internal Medicine

## 2018-08-14 ENCOUNTER — Other Ambulatory Visit: Payer: Self-pay | Admitting: Internal Medicine

## 2018-08-20 ENCOUNTER — Ambulatory Visit (INDEPENDENT_AMBULATORY_CARE_PROVIDER_SITE_OTHER): Payer: Self-pay | Admitting: Internal Medicine

## 2018-08-20 ENCOUNTER — Encounter: Payer: Self-pay | Admitting: Internal Medicine

## 2018-08-20 DIAGNOSIS — M545 Low back pain: Secondary | ICD-10-CM

## 2018-08-20 DIAGNOSIS — I10 Essential (primary) hypertension: Secondary | ICD-10-CM

## 2018-08-20 DIAGNOSIS — F5105 Insomnia due to other mental disorder: Secondary | ICD-10-CM

## 2018-08-20 DIAGNOSIS — F409 Phobic anxiety disorder, unspecified: Secondary | ICD-10-CM

## 2018-08-20 DIAGNOSIS — G8929 Other chronic pain: Secondary | ICD-10-CM

## 2018-08-20 DIAGNOSIS — F419 Anxiety disorder, unspecified: Secondary | ICD-10-CM

## 2018-08-20 DIAGNOSIS — F439 Reaction to severe stress, unspecified: Secondary | ICD-10-CM

## 2018-08-20 MED ORDER — TRAMADOL HCL 50 MG PO TABS: 50.0000 mg | ORAL_TABLET | Freq: Two times a day (BID) | ORAL | 3 refills | Status: DC | PRN

## 2018-08-20 MED ORDER — LOSARTAN POTASSIUM-HCTZ 100-25 MG PO TABS: 1.0000 | ORAL_TABLET | Freq: Every day | ORAL | 1 refills | Status: DC
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
## 2018-09-09 NOTE — Patient Instructions (Addendum)
It was a pleasure to see by virtual visit today.  Continue current medications.  Need follow-up visit in this office in 3 months once pandemic has dissipated.  If you move away from the area, we will be happy to transfer your records to the physician of your choice.

## 2018-09-21 ENCOUNTER — Other Ambulatory Visit: Payer: Self-pay | Admitting: Internal Medicine

## 2020-04-28 ENCOUNTER — Telehealth: Payer: Self-pay | Admitting: Internal Medicine

## 2020-04-28 NOTE — Telephone Encounter (Signed)
Called patient back to schedule appointment and she said she could not come today, maybe next week. So I told her to call back next week and we would see what we have available, however there would be very limited availability because of the Holiday.

## 2020-04-28 NOTE — Telephone Encounter (Signed)
3:30 pm to be worked in. Car visit

## 2020-04-28 NOTE — Telephone Encounter (Signed)
Truly Politano [phone]  Madison Norton called to say she was living in [place] but is back here now, she had COVID in October and PNA around [place].  She is now having pain in her ribs under her breast and upper back, sometimes gets sob and has lingering cough, would like to be seen.

## 2020-05-01 NOTE — Telephone Encounter (Signed)
Patient called back this afternoon stating she is still having discomfort in her lungs and some shortness of breath. She is going to call the ED where she went out of state and have them fax information here.

## 2020-05-02 ENCOUNTER — Ambulatory Visit
Admission: RE | Admit: 2020-05-02 | Discharge: 2020-05-02 | Disposition: A | Discharge status: Home / Self Care | Payer: 59 | Source: Ambulatory Visit | Attending: Internal Medicine | Admitting: Internal Medicine

## 2020-05-02 ENCOUNTER — Ambulatory Visit (INDEPENDENT_AMBULATORY_CARE_PROVIDER_SITE_OTHER): Payer: 59 | Admitting: Internal Medicine

## 2020-05-02 ENCOUNTER — Encounter: Payer: Self-pay | Admitting: Internal Medicine

## 2020-05-02 ENCOUNTER — Other Ambulatory Visit: Payer: Self-pay

## 2020-05-02 VITALS — Temp 98.9°F

## 2020-05-02 DIAGNOSIS — R0602 Shortness of breath: Secondary | ICD-10-CM

## 2020-05-02 DIAGNOSIS — Z8701 Personal history of pneumonia (recurrent): Secondary | ICD-10-CM | POA: Diagnosis not present

## 2020-05-02 DIAGNOSIS — I10 Essential (primary) hypertension: Secondary | ICD-10-CM | POA: Diagnosis not present

## 2020-05-02 DIAGNOSIS — Z8616 Personal history of COVID-19: Secondary | ICD-10-CM

## 2020-05-02 DIAGNOSIS — Z8639 Personal history of other endocrine, nutritional and metabolic disease: Secondary | ICD-10-CM

## 2020-05-02 MED ORDER — ALBUTEROL SULFATE HFA 108 (90 BASE) MCG/ACT IN AERS: 2.0000 | INHALATION_SPRAY | Freq: Four times a day (QID) | RESPIRATORY_TRACT | 0 refills | Status: DC | PRN

## 2020-05-02 MED ORDER — DOXYCYCLINE HYCLATE 100 MG PO TABS: 100.0000 mg | ORAL_TABLET | Freq: Two times a day (BID) | ORAL | 0 refills | Status: DC

## 2020-05-02 NOTE — Progress Notes (Signed)
   Subjective:    Patient ID: Madison Norton, female    DOB: April 22, 1961, 59 y.o.   MRN: [id number]  HPI 59 year old Female seen for SOB and discomfort right lower posterior lung area.  Patient was last seen here in F[place] 2020 for follow-up of hypertension.  She subsequently moved to [place] and on November 23 of this year she was seen at [facility] in [facility] with complaint of shortness of breath upper back pain and epigastric pain.  Apparently was diagnosed with bilateral pneumonia.  Was treated with prednisone 40 mg daily for 7 days.  Was given an albuterol inhaler and was placed on Zithromax Z-PAK and also Augmentin 875 mg twice daily for 10 days.  Symptoms improved.   Patient says that she will be returning to [place] to live.  She will be looking for job.  Past medical history: In 2019 she had community-acquired pneumonia of the left lower lobe clinically but chest x-ray proved to be negative.  Patient feels that pneumonia could possibly be recurrent at this time.  Chest x-ray was done today at [facility] showing atelectasis in left upper lobe, right midlung and right base without consolidation.  No pleural effusion.  She has a history of hypertension and hyperlipidemia.  History of vitamin D deficiency.  No history of operations.  No known drug allergies.  History of ocular migraines.  Does not smoke.  Seldom consumes alcohol.  Has 1 son.  She is divorced.  Family history: Father died at 60 years of age with complications of Parkinson's disease.  He had atrial fibrillation, anemia and was status post hip replacement.  Mother with history of lung cancer, osteoarthritis of the knees and kidney disease as well as hypertension.   Review of Systems some slight shortness of breath with deep breathing.  Pain in right lower posterior chest.  No hemoptysis.        Objective:   Physical Exam  Temperature 98.9 degrees pulse oximetry  97% respiratory rate is normal.  TMs are clear.  Neck is supple.  Chest clear to auscultation without frank rales or wheezing.      Assessment & Plan:  History of bilateral pneumonia: Note from November 23 in [facility] [place], emergency department says chest x-ray showed bilateral mild infiltrates.  Says patient was diagnosed with COVID-19 on October 30.  Notes indicate PO2 was 77 on room air with PCO2 of 39 and bicarbonate of 24.  Was treated with IV Rocephin 2 g, IV Zithromax, IV Decadron and Ventolin nebulizer solution in the emergency department and subsequently discharged on Zithromax Z-Pak Augmentin albuterol inhaler and prednisone.  History of hypertension treated in the past with Hyzaar 100/25 daily  History of hyperlipidemia  Anxiety and depression  History of migraine headaches treated in the past was only  History of COVID-29 February 2020  Plan: Patient had chest x-ray today at [facility] diagnostic showing scattered atelectasis and no discrete infiltrate.  Was treated with doxycycline 100 mg twice daily for 10 days and Ventolin inhaler.  No wheezing is noted and she was not given prednisone.  Rest and drink plenty of fluids.  Call if not improving in 48 to 72 hours or sooner if worse.

## 2020-05-02 NOTE — Patient Instructions (Addendum)
Doxycycline 100 mg twice daily x 10 days.  Ventolin inhaler 2 sprays by mouth 4 times daily as needed for cough, shortness of breath, or wheezing.  Have chest x-ray today.  Had COVID-19 infection in October.  Needs follow-up appointment regarding hypertension and hyperlipidemia as well as health maintenance exam in 4 to 6 weeks. in 4-6 weeks.

## 2020-05-17 ENCOUNTER — Telehealth: Payer: Self-pay | Admitting: Internal Medicine

## 2020-05-17 NOTE — Telephone Encounter (Signed)
CXR did not show pneumonia. Not sure what is causing nausea or bloating at this time. Happy to see her in office.

## 2020-05-17 NOTE — Telephone Encounter (Signed)
Rheba Kaufmann [phone]  Desiray called to say she finished antidotic on Sunday and she started new job on Monday. She had very little discomfort while on medication, when she finished it she started having some blotted feeling in her stomach and waves of nausea, plus she still having some pain underneath her breast in the rib area and in back mostly in the evening, she is wandering would this just be some lingering from the pneumonia. Does she need more medication to make sure it is all gone? I did let her know it would be tomorrow before I could give her answer you were out of office today.

## 2020-05-18 NOTE — Telephone Encounter (Signed)
Called patient to schedule and OV and she stated that last night she seem to be better. So she will call back if it comes back.

## 2021-04-24 ENCOUNTER — Telehealth: Payer: Self-pay | Admitting: Internal Medicine

## 2021-04-24 NOTE — Telephone Encounter (Signed)
Rosann Broome [phone]  Vinie called to say she is having problems at times breathing, SOB she is alright when laying down, wandering if something is still going on from where she had PNA and COVID last year. About 2 weeks ago she had a spell where she was hurting in her chest and rib area, it was so bad she could not go anywhere for 3 days. I let her know that is when she needed to be seen, she stated she did not have insurance or money.  She has itchy dry cough, done COVID test on 04/04/21 was negative.  She would also like you to take over her blood pressure medication, I told her that would be a separate appointment. CPE booking out into March.  She is also having mid back pain, says it is more on right side and is getting worse. I also let her know you where out of office and we have no appointments this week and is limited next 2 weeks because of Holidays, she stated this could wait a couple of weeks.

## 2021-05-01 NOTE — Telephone Encounter (Signed)
Tried to call patient and mail box was full, unable to LVM

## 2021-05-02 NOTE — Telephone Encounter (Signed)
Called patient to let her know we did not have anything till Jan. She was not having anymore SOB. I did let her know she would need CPE to get blood pressure medication and she would need to be seen annual to be consider patient and would need to follow up on her labs and medications. I also let her know any SOB now would not be from where she had COVID or PNA in 2021. Patient verbalized understanding. She scheduled appointment 05/25/2021 also went over NO Show policy and canceling appointments in less than 24 hours was also consider No Show appointments and had a charge.

## 2021-05-25 ENCOUNTER — Other Ambulatory Visit: Payer: Self-pay

## 2021-05-25 ENCOUNTER — Encounter: Payer: Self-pay | Admitting: Internal Medicine

## 2021-05-25 ENCOUNTER — Ambulatory Visit (INDEPENDENT_AMBULATORY_CARE_PROVIDER_SITE_OTHER): Payer: 59 | Admitting: Internal Medicine

## 2021-05-25 VITALS — BP 122/72 | HR 97 | Temp 99.5°F | Wt 206.0 lb

## 2021-05-25 DIAGNOSIS — R82998 Other abnormal findings in urine: Secondary | ICD-10-CM | POA: Diagnosis not present

## 2021-05-25 DIAGNOSIS — M549 Dorsalgia, unspecified: Secondary | ICD-10-CM

## 2021-05-25 LAB — POCT URINALYSIS DIPSTICK
Bilirubin, UA: NEGATIVE
Blood, UA: NEGATIVE
Glucose, UA: NEGATIVE
Ketones, UA: NEGATIVE
Nitrite, UA: NEGATIVE
Protein, UA: NEGATIVE
Spec Grav, UA: 1.02 (ref 1.010–1.025)
Urobilinogen, UA: 0.2 E.U./dL
pH, UA: 5 (ref 5.0–8.0)

## 2021-05-25 MED ORDER — LOSARTAN POTASSIUM-HCTZ 100-25 MG PO TABS: 1.0000 | ORAL_TABLET | Freq: Every day | ORAL | 1 refills | Status: DC

## 2021-05-25 MED ORDER — TRAMADOL HCL 50 MG PO TABS
50.0000 mg | ORAL_TABLET | Freq: Three times a day (TID) | ORAL | 0 refills | Status: AC | PRN
Start: 2021-05-25 — End: 2021-05-30

## 2021-05-25 MED ORDER — LORAZEPAM 1 MG PO TABS: 1.0000 mg | ORAL_TABLET | Freq: Every day | ORAL | 1 refills | Status: DC

## 2021-05-25 NOTE — Patient Instructions (Addendum)
Take Lorazepam at night for sleep. Take Tramadol sparingly for back pain. Losartan HCTZ refilled. Keep appt for CPE in May. CXR ordered as requested.

## 2021-05-25 NOTE — Progress Notes (Signed)
° °  Subjective:    Patient ID: Madison Norton, female    DOB: [birth year], 61 y.o.   MRN: [id number]  HPI 61 year old Female not seen since December 2021.At that time, was here for follow up on bilateral pneumonia that had been dx in [place]. Stated she had Covid-19 October 30,2021.   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 Says mother is in poor health due to a stroke. Work is stressful. Patient resides alone with her dog. Says she can't get to sleep before 2 am due to anxiety. Naps as soon as she gets home from work. Then can't get back to sleep until after midnight. Asking for Ativan for anxiety and sleep. This was provided but she should seek counseling perhaps at mental Health for this longstanding issue. She is thinking about retiring early at age 40. Says she struggles with job performance issues.  Patient called in December complaining of SOB at times. Is worried about lung cancer, CXR will be ordered. Had negative Covid test in November 2022.  Hx of community acquired pneumonia left lower lobe clinically but CXR was negative. Apparently was dx with pneumonia in [place] in 2021 treated with Prednisone and Z-pak. CXR in December 2021  CPE and lab work booked for May.  Losartan/HCTZ refilled for HTN.  Having some chronic right sided thoracic back pain. Denies fall or injury. Worried about lung cancer but does not smoke.  Review of Systems dipstick urine has small LE but no urinary symptoms. No hematuria.Culture pending.     Objective:   Physical Exam VS reviewed, Patient is anxious. Mild tenderness right lower posterolateral rib, Chest is clear. CXR ordered.No thyromegaly. No bruits, No adenopathy in neck. No LE edema.       Assessment & Plan:  Insomnia due to anxiety and fear. Needs counseling. Ativan prescribed as requested. Keep appt for CPE in May.  Chronic right sided thoracic back pain. Seems musculoskeletal.  CXR ordered as patient worried about lung cancer. Treat with Tramadol sparingly.Rx sent.  Abnormal urine dipstick and urine culture sent. Asymptomatic- could be contaminat  HTN longstanding treated with Losartan/HCTZ which was refilled until May when she is scheduled for CPE.  Job performance issues may be related to not sleeping.It is not clear to me what the issues at work are.

## 2021-05-26 LAB — URINE CULTURE
MICRO NUMBER:: 12868724
SPECIMEN QUALITY:: ADEQUATE

## 2021-09-21 ENCOUNTER — Other Ambulatory Visit: Payer: 59

## 2021-09-28 ENCOUNTER — Ambulatory Visit (INDEPENDENT_AMBULATORY_CARE_PROVIDER_SITE_OTHER): Payer: 59 | Admitting: Internal Medicine

## 2021-09-28 ENCOUNTER — Other Ambulatory Visit (HOSPITAL_COMMUNITY)
Admission: RE | Admit: 2021-09-28 | Discharge: 2021-09-28 | Disposition: A | Discharge status: Home / Self Care | Payer: 59 | Source: Ambulatory Visit | Attending: Internal Medicine | Admitting: Internal Medicine

## 2021-09-28 ENCOUNTER — Encounter: Payer: Self-pay | Admitting: Internal Medicine

## 2021-09-28 VITALS — BP 132/78 | HR 104 | Ht 63.0 in | Wt 207.5 lb

## 2021-09-28 DIAGNOSIS — Z87898 Personal history of other specified conditions: Secondary | ICD-10-CM

## 2021-09-28 DIAGNOSIS — F409 Phobic anxiety disorder, unspecified: Secondary | ICD-10-CM

## 2021-09-28 DIAGNOSIS — R5383 Other fatigue: Secondary | ICD-10-CM

## 2021-09-28 DIAGNOSIS — Z124 Encounter for screening for malignant neoplasm of cervix: Secondary | ICD-10-CM | POA: Diagnosis not present

## 2021-09-28 DIAGNOSIS — Z1211 Encounter for screening for malignant neoplasm of colon: Secondary | ICD-10-CM

## 2021-09-28 DIAGNOSIS — F411 Generalized anxiety disorder: Secondary | ICD-10-CM

## 2021-09-28 DIAGNOSIS — E611 Iron deficiency: Secondary | ICD-10-CM

## 2021-09-28 DIAGNOSIS — E782 Mixed hyperlipidemia: Secondary | ICD-10-CM

## 2021-09-28 DIAGNOSIS — I10 Essential (primary) hypertension: Secondary | ICD-10-CM

## 2021-09-28 DIAGNOSIS — Z8639 Personal history of other endocrine, nutritional and metabolic disease: Secondary | ICD-10-CM | POA: Diagnosis not present

## 2021-09-28 DIAGNOSIS — Z0000 Encounter for general adult medical examination without abnormal findings: Secondary | ICD-10-CM | POA: Diagnosis not present

## 2021-09-28 DIAGNOSIS — E119 Type 2 diabetes mellitus without complications: Secondary | ICD-10-CM

## 2021-09-28 DIAGNOSIS — F5105 Insomnia due to other mental disorder: Secondary | ICD-10-CM

## 2021-09-28 LAB — POCT URINALYSIS DIPSTICK
Bilirubin, UA: NEGATIVE
Blood, UA: NEGATIVE
Glucose, UA: NEGATIVE
Ketones, UA: NEGATIVE
Leukocytes, UA: NEGATIVE
Nitrite, UA: NEGATIVE
Protein, UA: NEGATIVE
Spec Grav, UA: >=1.03 — AB (ref 1.010–1.025)
Urobilinogen, UA: 0.2 E.U./dL
pH, UA: 5 (ref 5.0–8.0)

## 2021-09-28 MED ORDER — LORAZEPAM 1 MG PO TABS: 1.0000 mg | ORAL_TABLET | Freq: Every day | ORAL | 2 refills | Status: DC

## 2021-09-28 MED ORDER — LOSARTAN POTASSIUM-HCTZ 100-25 MG PO TABS: 1.0000 | ORAL_TABLET | Freq: Every day | ORAL | 1 refills | Status: AC

## 2021-09-28 NOTE — Progress Notes (Signed)
   Subjective:    Patient ID: Madison Norton, female    DOB: [birth year], 61 y.o.   MRN: [id number]  HPI 61 year old Female seen for health maintenance exam and evaluation of medical issues. Patient says she will schedule mammogram in the near future. Says she will do Cologard instead of colonoscopy. Declines Pneumococcal 20 vaccine.  She has a history of hypertension.  History of hyperlipidemia  History of bilateral pneumonia diagnosed in November 2021 at [facility] in [place].  He was treated with Zithromax, Augmentin and albuterol inhaler.  Past medical history: In 2019 she had community-acquired pneumonia of the left lower lobe clinically however chest x-ray was negative at that time.  No known drug allergies  No history of operations  History of vitamin D deficiency  History of ocular migraines.  Social history: Does not smoke.  Seldom consumes alcohol.  She has 1 son.  She is divorced.  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  Work is stressful.    Review of Systems Patient requests Cologard instead of colonoscopy.     Objective:   Physical Exam   VS reviewed. Skin: She has small area on lower leg that she would like checked by Dermatology.  Nodes none.  TMs clear.  Pharynx clear.  Neck supple.  No carotid bruits.  Chest clear to auscultation.  Breasts are without masses.  Cardiac exam: Regular rate and rhythm.  Abdomen soft nondistended without hepatosplenomegaly masses or tenderness.  No pitting edema of the lower extremities.  Neuro is intact without gross focal deficits.  Pap taken and results are normal.  Cologuard ordered      Assessment & Plan:  Essential hypertension treated with losartan/HCTZ 100/25 daily  Type 2 diabetes mellitus with hemoglobin A1c 7.6%.  Fasting glucose is 140.  Have prescribed metformin 500 mg daily with breakfast.  Iron deficiency.  Level is 39.  Her hemoglobin is normal.  Her MCV is  77.5  Her calcium is 11 and previously was 10.5 in 2019.  She does not want further testing for hypercalcemia.  Mixed hyperlipidemia.  Started on rosuvastatin 10 mg daily and will need follow-up in July.  Anxiety and insomnia-prescribed lorazepam 1 mg at bedtime which she says works well for her  Low HDL of 45  Plan: She agrees to follow-up in July.  See above for medications and follow-up that will be necessary.    See above regimen which will be tried.  Cologuard ordered.  Pap smear within normal limits.

## 2021-10-02 LAB — CBC WITH DIFFERENTIAL/PLATELET
Absolute Monocytes: 891 cells/uL (ref 200–950)
Basophils Absolute: 52 cells/uL (ref 0–200)
Basophils Relative: 0.4 %
Eosinophils Absolute: 236 cells/uL (ref 15–500)
Eosinophils Relative: 1.8 %
HCT: 41.7 % (ref 35.0–45.0)
Hemoglobin: 12.8 g/dL (ref 11.7–15.5)
Lymphs Abs: 2725 cells/uL (ref 850–3900)
MCH: 23.8 pg — ABNORMAL LOW (ref 27.0–33.0)
MCHC: 30.7 g/dL — ABNORMAL LOW (ref 32.0–36.0)
MCV: 77.5 fL — ABNORMAL LOW (ref 80.0–100.0)
MPV: 10.8 fL (ref 7.5–12.5)
Monocytes Relative: 6.8 %
Neutro Abs: 9196 cells/uL — ABNORMAL HIGH (ref 1500–7800)
Neutrophils Relative %: 70.2 %
Platelets: 393 10*3/uL (ref 140–400)
RBC: 5.38 10*6/uL — ABNORMAL HIGH (ref 3.80–5.10)
RDW: 15.5 % — ABNORMAL HIGH (ref 11.0–15.0)
Total Lymphocyte: 20.8 %
WBC: 13.1 10*3/uL — ABNORMAL HIGH (ref 3.8–10.8)

## 2021-10-02 LAB — IRON, TOTAL/TOTAL IRON BINDING CAP
%SAT: 9 % (calc) — ABNORMAL LOW (ref 16–45)
Iron: 39 ug/dL — ABNORMAL LOW (ref 45–160)
TIBC: 430 mcg/dL (calc) (ref 250–450)

## 2021-10-02 LAB — LIPID PANEL
Cholesterol: 285 mg/dL — ABNORMAL HIGH (ref <200)
HDL: 45 mg/dL — ABNORMAL LOW (ref >=50)
LDL Cholesterol (Calc): 207 mg/dL (calc) — ABNORMAL HIGH
Non-HDL Cholesterol (Calc): 240 mg/dL (calc) — ABNORMAL HIGH (ref <130)
Total CHOL/HDL Ratio: 6.3 (calc) — ABNORMAL HIGH (ref <5.0)
Triglycerides: 165 mg/dL — ABNORMAL HIGH (ref <150)

## 2021-10-02 LAB — COMPLETE METABOLIC PANEL WITH GFR
AG Ratio: 1.5 (calc) (ref 1.0–2.5)
ALT: 16 U/L (ref 6–29)
AST: 16 U/L (ref 10–35)
Albumin: 4.4 g/dL (ref 3.6–5.1)
Alkaline phosphatase (APISO): 111 U/L (ref 37–153)
BUN: 13 mg/dL (ref 7–25)
CO2: 25 mmol/L (ref 20–32)
Calcium: 11 mg/dL — ABNORMAL HIGH (ref 8.6–10.4)
Chloride: 104 mmol/L (ref 98–110)
Creat: 0.97 mg/dL (ref 0.50–1.05)
Globulin: 3 g/dL (calc) (ref 1.9–3.7)
Glucose, Bld: 140 mg/dL — ABNORMAL HIGH (ref 65–99)
Potassium: 4.4 mmol/L (ref 3.5–5.3)
Sodium: 140 mmol/L (ref 135–146)
Total Bilirubin: 0.6 mg/dL (ref 0.2–1.2)
Total Protein: 7.4 g/dL (ref 6.1–8.1)
eGFR: 66 mL/min/{1.73_m2} (ref >=60)

## 2021-10-02 LAB — HEMOGLOBIN A1C
Hgb A1c MFr Bld: 7.6 % of total Hgb — ABNORMAL HIGH (ref <5.7)
Mean Plasma Glucose: 171 mg/dL
eAG (mmol/L): 9.5 mmol/L

## 2021-10-02 LAB — CYTOLOGY - PAP: Diagnosis: NEGATIVE

## 2021-10-02 LAB — TSH: TSH: 1.9 mIU/L (ref 0.40–4.50)

## 2021-10-02 MED ORDER — ROSUVASTATIN CALCIUM 10 MG PO TABS: 10.0000 mg | ORAL_TABLET | Freq: Every day | ORAL | 3 refills | Status: DC

## 2021-10-02 NOTE — Progress Notes (Signed)
6 weeks Fu and lab scheduled. Crestor sent in.

## 2021-10-04 ENCOUNTER — Other Ambulatory Visit: Payer: Self-pay

## 2021-10-04 MED ORDER — METFORMIN HCL 500 MG PO TABS: 500.0000 mg | ORAL_TABLET | Freq: Every day | ORAL | 0 refills | Status: DC

## 2021-11-06 ENCOUNTER — Other Ambulatory Visit: Payer: 59

## 2021-11-07 NOTE — Patient Instructions (Addendum)
Patient agrees to follow-up in July.  Cologuard ordered.  Mammogram ordered.  Take lorazepam sparingly at bedtime for sleep and anxiety.  Start rosuvastatin 10 mg daily and follow-up in July.  Calcium is elevated at 11 and previously was 10.5 in 2019 but she is asymptomatic with regard to elevated calcium.  She has iron deficiency and her MCV is 77.5.  Her hemoglobin is normal.  I have recommended iron supplement but she is not sure she tolerates this due to GI issues.  Have prescribed metformin 500 mg daily with breakfast for type 2 diabetes mellitus with follow-up in July.  For hypertension prescribe losartan/HCTZ 100/25 daily.

## 2021-11-09 ENCOUNTER — Ambulatory Visit (INDEPENDENT_AMBULATORY_CARE_PROVIDER_SITE_OTHER): Payer: 59 | Admitting: Internal Medicine

## 2021-11-09 ENCOUNTER — Encounter: Payer: Self-pay | Admitting: Internal Medicine

## 2021-11-09 ENCOUNTER — Other Ambulatory Visit: Payer: 59

## 2021-11-09 VITALS — BP 142/98 | HR 110 | Temp 98.4°F | Wt 200.5 lb

## 2021-11-09 DIAGNOSIS — F411 Generalized anxiety disorder: Secondary | ICD-10-CM

## 2021-11-09 DIAGNOSIS — F439 Reaction to severe stress, unspecified: Secondary | ICD-10-CM

## 2021-11-09 DIAGNOSIS — S61452A Open bite of left hand, initial encounter: Secondary | ICD-10-CM | POA: Diagnosis not present

## 2021-11-09 DIAGNOSIS — S61451A Open bite of right hand, initial encounter: Secondary | ICD-10-CM

## 2021-11-09 DIAGNOSIS — Z23 Encounter for immunization: Secondary | ICD-10-CM | POA: Diagnosis not present

## 2021-11-09 DIAGNOSIS — E119 Type 2 diabetes mellitus without complications: Secondary | ICD-10-CM

## 2021-11-09 DIAGNOSIS — E782 Mixed hyperlipidemia: Secondary | ICD-10-CM

## 2021-11-09 LAB — GLUCOSE, POCT (MANUAL RESULT ENTRY): POC Glucose: 166 mg/dl — AB (ref 70–99)

## 2021-11-09 MED ORDER — MUPIROCIN 2 % EX OINT
1.0000 | TOPICAL_OINTMENT | Freq: Two times a day (BID) | CUTANEOUS | 0 refills | Status: DC
Start: 2021-11-09 — End: 2022-03-12

## 2021-11-09 MED ORDER — AMOXICILLIN-POT CLAVULANATE 500-125 MG PO TABS: 1.0000 | ORAL_TABLET | Freq: Three times a day (TID) | ORAL | 0 refills | Status: AC

## 2021-11-09 NOTE — Progress Notes (Signed)
Subjective:    Patient ID: Madison Norton, female    DOB: [birth year], 61 y.o.   MRN: [id number]  HPI Here for follow up on medical issues including type 2 Diabetes mellitus and hyperlipidemia.  She has hx HTN as well.   In the interim, 2 days ago, she has suffered dog bites on index, long, and fourth fingers of left hand.  Fourth finger has some evidence of secondary bacterial infection.  Apparently she has been renting a room from an individual she is known for some time who had an older dog.  Patient also has a dog and these 2 dogs got into a fight.  She tried to separate the dogs and her dog actually bit her.  She says her dog has had rabies vaccination that is up-to-date.  It seems that the other dog passed away as result of injuries during this fight.  She is distraught.  She feels that she needs to move out of this residence but does not have anywhere to go.  Says she has been trying to take care of of her health and is working 3 days a week.  Work is somewhat stressful but some of the personnel she was working with have left and that is somewhat of a relief.  She is anxious today and tearful.  She feels fatigued.  In May, hemoglobin A1c was 7.6%.  Her iron level at that time was low at 39.  We did not check iron level today. She is taking an iron supplement.  Accu-Chek obtained in office today is 166.  Hgb AIC drawn and pending as well as lipid panel and liver functions.  She is taking Crestor generic 10 mg daily and metformin 500 mg daily.  She does not take the metformin until afternoon when she eats her first meal around 3:00pm.  Says she is staying well-hydrated.  Electrolytes were not checked today.  Says [employer] will be ending by the end of the year.  Has hx mild iron deficiency likely nutritional and takes iron supplement  Review of Systems denies fever, chills, nausea or vomiting.     Objective:   Physical Exam   BP 148/98 pulse 110 - did not take BP med this  morning Has superficial abrasion dorsal aspect on the right hand approximately 2.5 cm in length .  On her left hand, the second third and fourth fingers have lacerations front and back approximately 1 cm or so in length that are consistent with animal bites.  The fourth finger appears to be secondarily infected.  Tetanus immunization update given today.    Assessment & Plan:  Dog bite lacerations left second,third, and fourth fingers.  The fourth finger appears to have an infected laceration.  She will be placed on Augmentin 500 mg 3 times a day.  Tetanus update given today.  Patient assures me her dog has a current Rabies vaccine.  Use generic Bactroban 2% ointment on open lacerations.  She is to wash these areas with warm soapy water twice daily and apply Bactroban.  Is to call me if these lesions develop drainage and/or redness or if she develops systemic symptoms such as fever and chills.  Situational stress  Type 2 diabetes mellitus-hemoglobin A1c drawn today.  Patient is on metformin 500 mg daily.  Hyperlipidemia-has been on statin therapy-lipid panel and liver functions to be drawn on July 7  Anxiety state  Essential hypertension-patient is upset today and blood pressure reading is elevated.  History of  insomnia treated with lorazepam.

## 2021-11-09 NOTE — Patient Instructions (Addendum)
Clean wounds with warm soapy water twice daily, apply peroxide twice daily after cleaning and then apply mupirocin ointment.  Keep wounds wrapped.  Take Augmentin 500 mg 3 times a day for 7 days.  Follow-up here next week.  Tetanus immunization update given.   On July 7 fasting labs are to be drawn including lipid panel and liver functions.

## 2021-11-09 NOTE — Progress Notes (Deleted)
   Subjective:    Patient ID: Madison Norton, female    DOB: [birth year], 61 y.o.   MRN: [id number]  HPI  61 year old Female     Review of Systems     Objective:   Physical Exam        Assessment & Plan:

## 2021-11-10 LAB — HEMOGLOBIN A1C
Hgb A1c MFr Bld: 6.8 % of total Hgb — ABNORMAL HIGH (ref <5.7)
Mean Plasma Glucose: 148 mg/dL
eAG (mmol/L): 8.2 mmol/L

## 2021-11-10 LAB — HEPATIC FUNCTION PANEL
AG Ratio: 1.8 (calc) (ref 1.0–2.5)
ALT: 15 U/L (ref 6–29)
AST: 14 U/L (ref 10–35)
Albumin: 4.7 g/dL (ref 3.6–5.1)
Alkaline phosphatase (APISO): 96 U/L (ref 37–153)
Bilirubin, Direct: 0.1 mg/dL (ref 0.0–0.2)
Globulin: 2.6 g/dL (calc) (ref 1.9–3.7)
Indirect Bilirubin: 0.7 mg/dL (calc) (ref 0.2–1.2)
Total Bilirubin: 0.8 mg/dL (ref 0.2–1.2)
Total Protein: 7.3 g/dL (ref 6.1–8.1)

## 2021-11-10 LAB — LIPID PANEL
Cholesterol: 136 mg/dL (ref <200)
HDL: 40 mg/dL — ABNORMAL LOW (ref >=50)
LDL Cholesterol (Calc): 71 mg/dL (calc)
Non-HDL Cholesterol (Calc): 96 mg/dL (calc) (ref <130)
Total CHOL/HDL Ratio: 3.4 (calc) (ref <5.0)
Triglycerides: 175 mg/dL — ABNORMAL HIGH (ref <150)

## 2021-11-16 ENCOUNTER — Ambulatory Visit (INDEPENDENT_AMBULATORY_CARE_PROVIDER_SITE_OTHER): Payer: 59 | Admitting: Internal Medicine

## 2021-11-16 ENCOUNTER — Encounter: Payer: Self-pay | Admitting: Internal Medicine

## 2021-11-16 VITALS — BP 130/70 | HR 103 | Temp 99.3°F | Ht 63.0 in | Wt 197.8 lb

## 2021-11-16 DIAGNOSIS — F439 Reaction to severe stress, unspecified: Secondary | ICD-10-CM

## 2021-11-16 DIAGNOSIS — E119 Type 2 diabetes mellitus without complications: Secondary | ICD-10-CM

## 2021-11-16 DIAGNOSIS — S61452D Open bite of left hand, subsequent encounter: Secondary | ICD-10-CM | POA: Diagnosis not present

## 2021-11-16 LAB — POCT CBG (FASTING - GLUCOSE)-MANUAL ENTRY: Glucose Fasting, POC: 157 mg/dL — AB (ref 70–99)

## 2021-11-16 MED ORDER — METFORMIN HCL 500 MG PO TABS: 500.0000 mg | ORAL_TABLET | Freq: Two times a day (BID) | ORAL | 3 refills | Status: DC

## 2021-11-16 NOTE — Progress Notes (Signed)
Subjective:    Patient ID: Madison Norton, female    DOB: [birth year], 61 y.o.   MRN: [id number]  HPI  61 year old Female seen for follow up on  Type 2 Diabetes mellitus as well as animal bites left hand and superficial abrasion right hand.  She was here on June 30 having sustained dog bites 2 days previously on index long and fourth fingers of left hand.  She also had superficial abrasion right hand.  She was placed on Augmentin 500 mg 3 times a day for 7 days and is here now for follow-up.  She received tetanus immunization update on June 30.  She recently had fasting labs drawn for follow-up on diabetes mellitus.  A lipid panel is also pending.  She is on Crestor 10 mg daily as well as losartan/HCTZ 100/25 daily and metformin 500 mg daily.  Her hemoglobin A1c has improved from 7.6% a month ago to 6.8% just with low-dose metformin.  This is excellent.  She is watching her diet.  Does not get a lot of exercise.  Unfortunately she recently lost her job.  She says she was told that she smelled like smoke at work.  She is distraught.  She still living in the same home that she was living in previously.  She had hoped to move to a new place and had a prospect on a new place but since she lost her job she will likely not be able to afford it.  She had requested a Dexcom glucose monitor.  We wrote an order for this but unfortunately her insurance plan has encountered some financial issues and it is my supposition that they will not likely grant her this device since it is fairly expensive.  She may need to purchase a home glucose monitor and check Accu-Cheks before breakfast and before supper.  We checked Accu-Chek today at around 3:30 PM and level is 157.  I think she can increase metformin to 500 mg twice daily from once daily.    Review of Systems visibly upset in office today relaying issues surrounding her job.  Says she cannot afford to move without a job.  We discussed options for finding a new  job.     Objective:   Physical Exam Blood pressure 130/70 pulse 103 temperature 99.3 degrees by ear thermometer weight 197 pounds 12 ounces BMI 35.03.  Previous weight on June 30 was 200 pounds 8 ounces and BMI was 35.52 The animal bites have mostly healed and showed no evidence of secondary bacterial infection.  There were lacerations on second, third and fourth fingers front and back that are healing nicely and show no evidence of drainage or redness.  She got a tetanus update at last visit.  Accu-Chek today here in the office is 157      Assessment & Plan:  Animal bites left hand-resolving.  Tetanus immunization is up-to-date.  Type 2 diabetes mellitus-increase metformin to 500 mg twice daily and follow-up in 6 weeks.  If Dexcom monitor is not covered by insurance, you may need to purchase a home glucose monitor and check Accu-Cheks before breakfast and before supper.  Please keep a record of these numbers.  Hypertension-stable with losartan/HCTZ 100/25 daily  Hyperlipidemia-lipid panel done on June 30 showed triglycerides of 175, HDL low at 40, normal total cholesterol of 136 and LDL cholesterol normal at 71.  Continue same dose of rosuvastatin 10 mg daily.  Watch diet and try to walk some for exercise.

## 2021-11-16 NOTE — Patient Instructions (Signed)
Increase metformin to 500 mg twice daily.  If Dexcom will not based covered by insurance plan suggest home glucose monitor checking before breakfast and supper.  I do not see any reason to check hours after a meal since finances are a problem at this point.  She will follow-up in 6 weeks.

## 2021-12-09 ENCOUNTER — Other Ambulatory Visit: Payer: Self-pay | Admitting: Internal Medicine

## 2022-01-08 ENCOUNTER — Other Ambulatory Visit: Payer: 59

## 2022-01-08 DIAGNOSIS — E119 Type 2 diabetes mellitus without complications: Secondary | ICD-10-CM

## 2022-01-09 LAB — HEMOGLOBIN A1C
Hgb A1c MFr Bld: 6.1 % of total Hgb — ABNORMAL HIGH (ref <5.7)
Mean Plasma Glucose: 128 mg/dL
eAG (mmol/L): 7.1 mmol/L

## 2022-01-10 ENCOUNTER — Ambulatory Visit: Payer: 59 | Admitting: Internal Medicine

## 2022-02-04 ENCOUNTER — Ambulatory Visit: Payer: 59 | Admitting: Skilled Nursing Facility1

## 2022-03-12 ENCOUNTER — Other Ambulatory Visit: Payer: Self-pay | Admitting: Internal Medicine

## 2022-03-12 ENCOUNTER — Other Ambulatory Visit: Payer: 59

## 2022-03-21 ENCOUNTER — Other Ambulatory Visit: Payer: 59

## 2022-03-22 ENCOUNTER — Other Ambulatory Visit: Payer: Self-pay

## 2022-03-22 ENCOUNTER — Ambulatory Visit: Payer: 59 | Admitting: Internal Medicine

## 2022-03-22 DIAGNOSIS — I10 Essential (primary) hypertension: Secondary | ICD-10-CM

## 2022-03-22 DIAGNOSIS — E782 Mixed hyperlipidemia: Secondary | ICD-10-CM

## 2022-03-22 DIAGNOSIS — E119 Type 2 diabetes mellitus without complications: Secondary | ICD-10-CM

## 2022-03-28 ENCOUNTER — Other Ambulatory Visit: Payer: Commercial Managed Care - HMO

## 2022-03-28 DIAGNOSIS — E782 Mixed hyperlipidemia: Secondary | ICD-10-CM

## 2022-03-28 DIAGNOSIS — I10 Essential (primary) hypertension: Secondary | ICD-10-CM

## 2022-03-28 DIAGNOSIS — E119 Type 2 diabetes mellitus without complications: Secondary | ICD-10-CM

## 2022-03-28 LAB — LIPID PANEL
Cholesterol: 256 mg/dL — ABNORMAL HIGH (ref <200)
HDL: 44 mg/dL — ABNORMAL LOW (ref >=50)
LDL Cholesterol (Calc): 172 mg/dL (calc) — ABNORMAL HIGH
Non-HDL Cholesterol (Calc): 212 mg/dL (calc) — ABNORMAL HIGH (ref <130)
Total CHOL/HDL Ratio: 5.8 (calc) — ABNORMAL HIGH (ref <5.0)
Triglycerides: 238 mg/dL — ABNORMAL HIGH (ref <150)

## 2022-03-28 LAB — HEPATIC FUNCTION PANEL
AG Ratio: 1.7 (calc) (ref 1.0–2.5)
ALT: 11 U/L (ref 6–29)
AST: 11 U/L (ref 10–35)
Albumin: 4.4 g/dL (ref 3.6–5.1)
Alkaline phosphatase (APISO): 115 U/L (ref 37–153)
Bilirubin, Direct: 0.1 mg/dL (ref 0.0–0.2)
Globulin: 2.6 g/dL (calc) (ref 1.9–3.7)
Indirect Bilirubin: 0.3 mg/dL (calc) (ref 0.2–1.2)
Total Bilirubin: 0.4 mg/dL (ref 0.2–1.2)
Total Protein: 7 g/dL (ref 6.1–8.1)

## 2022-03-28 LAB — MICROALBUMIN / CREATININE URINE RATIO
Creatinine, Urine: 118 mg/dL (ref 20–275)
Microalb Creat Ratio: 10 mcg/mg creat (ref <30)
Microalb, Ur: 1.2 mg/dL

## 2022-03-28 LAB — HEMOGLOBIN A1C
Hgb A1c MFr Bld: 6.4 % of total Hgb — ABNORMAL HIGH (ref <5.7)
Mean Plasma Glucose: 137 mg/dL
eAG (mmol/L): 7.6 mmol/L

## 2022-03-29 ENCOUNTER — Ambulatory Visit: Payer: Self-pay | Admitting: Internal Medicine

## 2022-04-01 ENCOUNTER — Encounter: Payer: Self-pay | Admitting: Internal Medicine

## 2022-04-01 ENCOUNTER — Telehealth: Payer: Self-pay | Admitting: Internal Medicine

## 2022-04-01 ENCOUNTER — Ambulatory Visit (INDEPENDENT_AMBULATORY_CARE_PROVIDER_SITE_OTHER): Payer: Commercial Managed Care - HMO | Admitting: Internal Medicine

## 2022-04-01 VITALS — BP 106/70 | HR 75 | Temp 99.5°F | Ht 63.0 in | Wt 179.8 lb

## 2022-04-01 DIAGNOSIS — F411 Generalized anxiety disorder: Secondary | ICD-10-CM | POA: Diagnosis not present

## 2022-04-01 DIAGNOSIS — E782 Mixed hyperlipidemia: Secondary | ICD-10-CM

## 2022-04-01 DIAGNOSIS — F439 Reaction to severe stress, unspecified: Secondary | ICD-10-CM | POA: Diagnosis not present

## 2022-04-01 DIAGNOSIS — I10 Essential (primary) hypertension: Secondary | ICD-10-CM | POA: Diagnosis not present

## 2022-04-01 DIAGNOSIS — S71152D Open bite, left thigh, subsequent encounter: Secondary | ICD-10-CM | POA: Diagnosis not present

## 2022-04-01 DIAGNOSIS — E119 Type 2 diabetes mellitus without complications: Secondary | ICD-10-CM

## 2022-04-01 MED ORDER — LORAZEPAM 1 MG PO TABS: ORAL_TABLET | ORAL | 1 refills | Status: DC

## 2022-04-01 NOTE — Patient Instructions (Addendum)
Take Metformin twice a day. RTC in 3 months. Tetanus is up to date.  Complete course of antibiotics that were prescribed at urgent care.  Try to work on diet.  We are sorry to hear about your recent situation.  We hope you feel better in the future.  Please take care of your leg wound.  Finish antibiotics as prescribed.  Continue antihypertensive medication.  Continue Crestor.  Our records indicate your tetanus immunization is up-to-date having been given in June 2023.

## 2022-04-01 NOTE — Progress Notes (Signed)
   Subjective:    Patient ID: Madison Norton, female    DOB: [birth year], 61 y.o.   MRN: [id number]  HPI 61 year old Female seen  for 6 month recheck but it seems she suffered a serious dog bite last Thursday. Was seen at [facility] urgent care and was prescribed antibiotics.  Patient says she was bitten twice recently by her own dog.  This has been devastating for her.  Patient tells me the dog was shot by a neighbor after the incident.  She is mourning the loss of her dog.  She does not understand why it turned on her.  She was here originally for 41-month recheck.  Most of this visit was spent discussing what it happened with her dog and how traumatic it had been for her.  Patient is residing in [facility] but has a job here in [place].  She has a history of diabetes mellitus/impaired glucose tolerance.  Recent A1c was 6.4% and 2 months ago had been 6.1%.  She has been on metformin.  Has not been eating well recently.  Also has history of hyperlipidemia treated with Crestor and hypertension treated with losartan/HCTZ.  Patient is asking for refill on Ativan today and this was provided.  Her tetanus immunization is up-to-date having been given in June 2023.      Review of Systems has lost 18 pounds since July.     Objective:   Physical Exam Temperature 99.5 degrees pulse oximetry 99% blood pressure 106/70 weight 179 pounds 12.8 ounces BMI 31.85  She has an open wound that is a V-shaped area left lateral thigh that is currently not draining.  She has bruising of her left thigh and several areas and some other superficial wounds as well in the left thigh area.       Assessment & Plan:  Traumatic event with her dog  Dog bite left thigh with multiple areas of bruising and superficial lacerations.  Tetanus immunization is up-to-date.  She is on Augmentin having been seen in urgent care this past Saturday, November 18.  Impaired glucose tolerance treated with  metformin  Anxiety-prescribed lorazepam 1 mg at bedtime number 30 tablets with 1 refill  Hypertension treated with losartan/HCTZ 100/25 daily and BP stable at this visit   Hyperlipidemia- Continue Crestor- recent lipid panel does not show good control. May have had dietary indiscretion with recent stress.   Glucose intolerance treated with metformin 500 mg twice daily- hemoglobin A1c of 6.4% and was 6.1% when checked 2 months ago.  Had been 7.6% prior to starting metformin some 6 months ago.  Recommend follow-up in 3 months.

## 2022-04-01 NOTE — Telephone Encounter (Signed)
When Madison Norton was checking out, she said she forgot to ask you for something to sleep.

## 2022-04-02 NOTE — Telephone Encounter (Signed)
Patient to let her know Dr Lenord Fellers was not going to call anything else in besides the Ativan and she said she could not pick that up till a later date, and she said that is why she wanted something else and hung up.

## 2022-04-02 NOTE — Telephone Encounter (Signed)
Patient was informed that Rx could not be released to her until 04/10/22 and no other Rx will be prescribed. Patient stated she understands

## 2022-05-09 ENCOUNTER — Other Ambulatory Visit: Payer: Self-pay | Admitting: Family Medicine

## 2022-06-07 ENCOUNTER — Other Ambulatory Visit: Payer: Self-pay | Admitting: Internal Medicine

## 2022-06-14 IMAGING — CR DG CHEST 2V
2 series · 2 of 2 positions shown · non-contrast
Comparison: July 09, 2017

CLINICAL DATA: Shortness of breath

EXAM:
CHEST - 2 VIEW

[w chest pa]
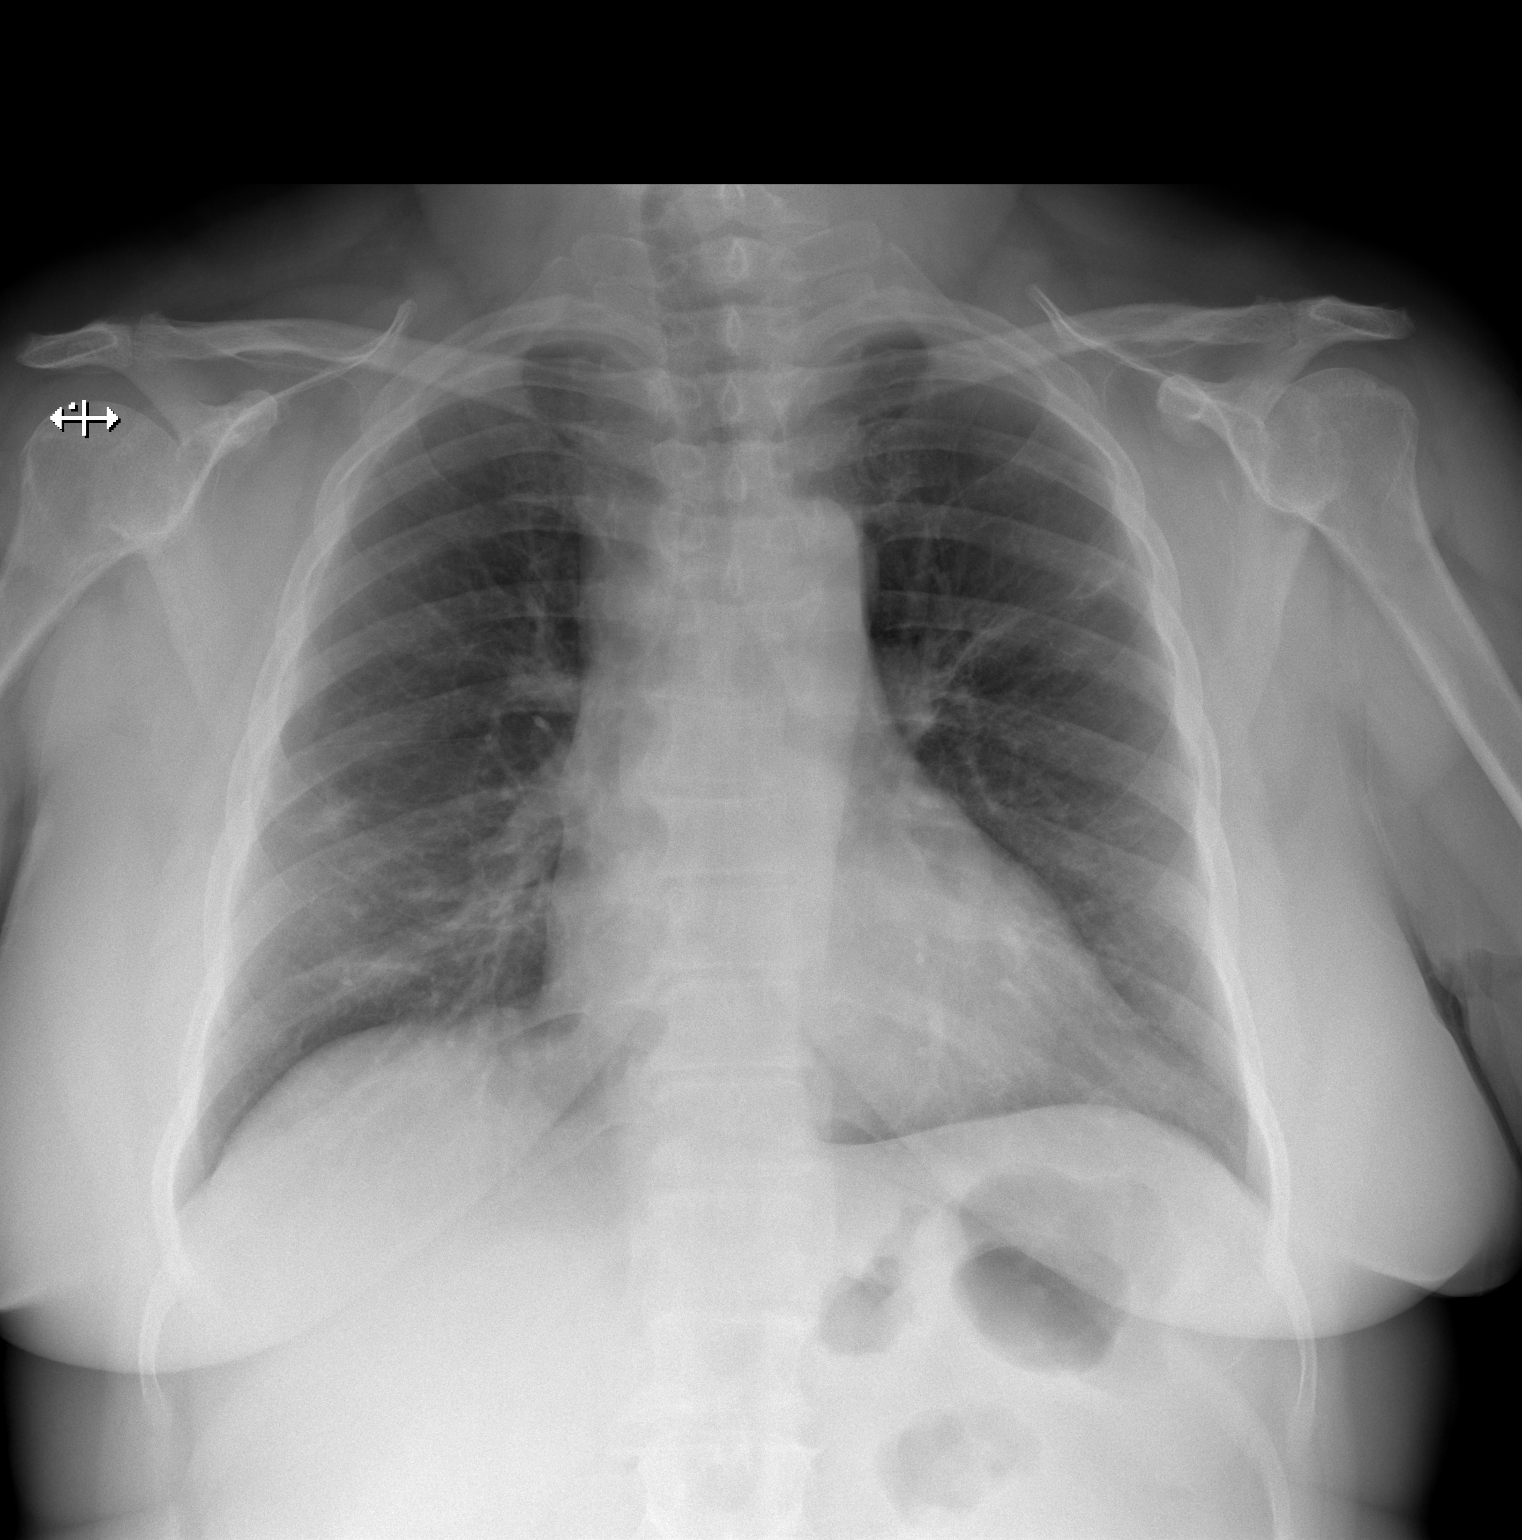

[w chest lat]
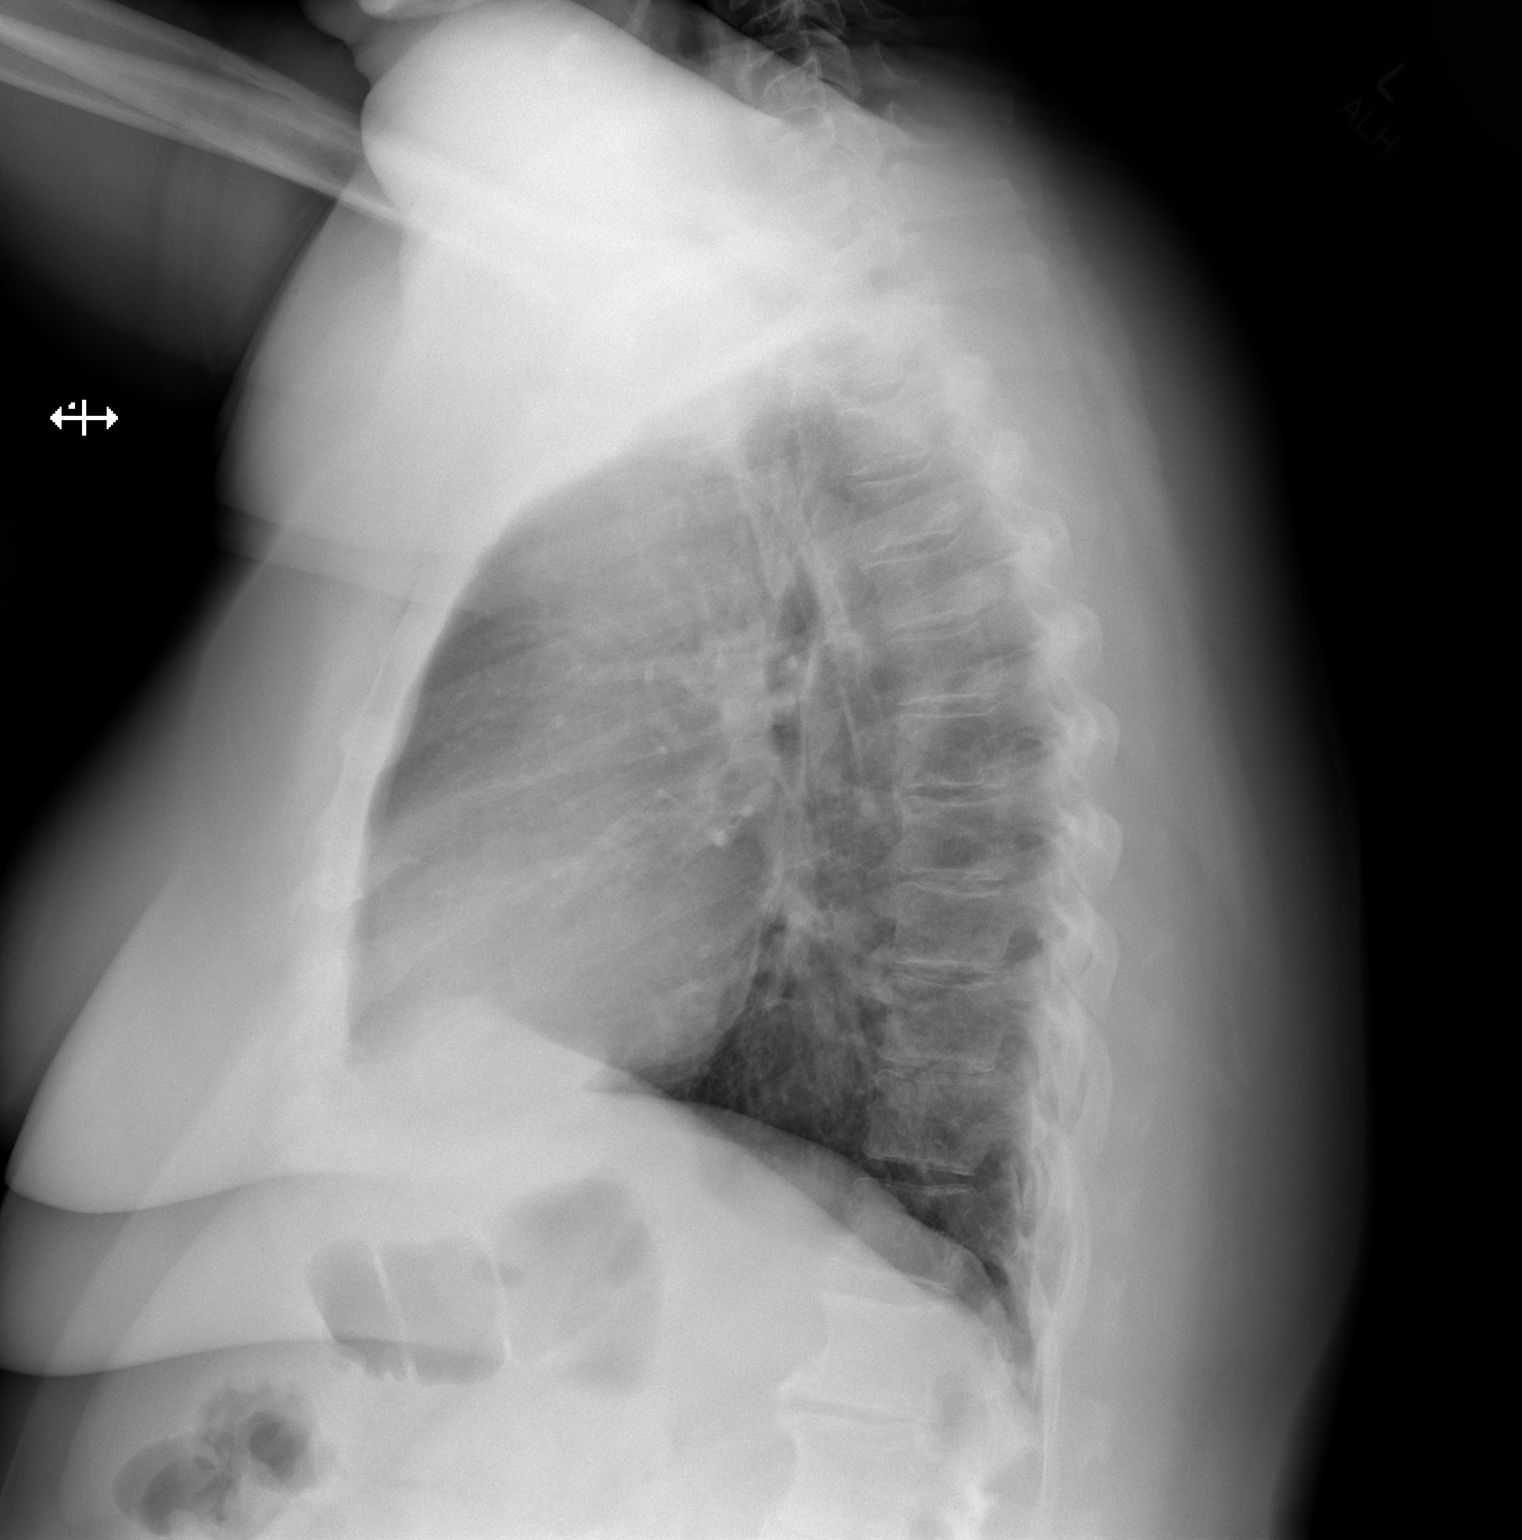

[2 of 2 positions shown; findings below may reference images not displayed]

FINDINGS: Areas of apparent atelectatic change noted in the left upper lobe,
right mid lung, and right base. No consolidation. Heart size and
pulmonary vascularity are normal. No adenopathy. There is
degenerative change in the thoracic spine.
IMPRESSION: Scattered areas of atelectatic change. No edema or airspace opacity.
Lungs elsewhere clear. Heart size normal.

## 2022-08-03 ENCOUNTER — Other Ambulatory Visit: Payer: Self-pay | Admitting: Internal Medicine

## 2022-08-07 ENCOUNTER — Other Ambulatory Visit: Payer: Self-pay | Admitting: Internal Medicine

## 2022-10-04 ENCOUNTER — Other Ambulatory Visit: Payer: Self-pay | Admitting: Internal Medicine

## 2022-10-04 DIAGNOSIS — Z8639 Personal history of other endocrine, nutritional and metabolic disease: Secondary | ICD-10-CM

## 2022-10-10 ENCOUNTER — Other Ambulatory Visit: Payer: BLUE CROSS/BLUE SHIELD

## 2022-10-10 DIAGNOSIS — I10 Essential (primary) hypertension: Secondary | ICD-10-CM

## 2022-10-10 DIAGNOSIS — E119 Type 2 diabetes mellitus without complications: Secondary | ICD-10-CM

## 2022-10-10 DIAGNOSIS — R5383 Other fatigue: Secondary | ICD-10-CM

## 2022-10-10 DIAGNOSIS — E782 Mixed hyperlipidemia: Secondary | ICD-10-CM

## 2022-10-17 ENCOUNTER — Encounter: Payer: Self-pay | Admitting: Internal Medicine

## 2022-10-17 ENCOUNTER — Encounter: Payer: BLUE CROSS/BLUE SHIELD | Admitting: Internal Medicine

## 2022-10-17 NOTE — Progress Notes (Unsigned)
Not seen since November 2023. DNKA today.

## 2022-12-26 ENCOUNTER — Other Ambulatory Visit: Payer: Self-pay | Admitting: Internal Medicine

## 2022-12-26 DIAGNOSIS — I10 Essential (primary) hypertension: Secondary | ICD-10-CM

## 2022-12-26 NOTE — Telephone Encounter (Signed)
Patient has been dismissed from practice as of 11/16/2022 for No Shows

## 2024-02-05 ENCOUNTER — Ambulatory Visit

## 2024-03-04 ENCOUNTER — Ambulatory Visit

## 2024-04-05 ENCOUNTER — Ambulatory Visit
